# Patient Record
Sex: Male | Born: 1955 | Race: Asian | Hispanic: No | Marital: Married | Smoking: Former smoker
Health system: Southern US, Community
[De-identification: ages and names within clinical notes are randomized; demographics above are authoritative.]

## PROBLEM LIST (undated history)

## (undated) DIAGNOSIS — I5031 Acute diastolic (congestive) heart failure: Secondary | ICD-10-CM

## (undated) DIAGNOSIS — K255 Chronic or unspecified gastric ulcer with perforation: Secondary | ICD-10-CM

## (undated) DIAGNOSIS — K2970 Gastritis, unspecified, without bleeding: Secondary | ICD-10-CM

## (undated) HISTORY — PX: REPAIR OF PERFORATED ULCER: SHX6065

---

## 2012-10-16 ENCOUNTER — Emergency Department (HOSPITAL_COMMUNITY): Payer: Medicaid Other | Admitting: Anesthesiology

## 2012-10-16 ENCOUNTER — Inpatient Hospital Stay (HOSPITAL_COMMUNITY)
Admission: EM | Admit: 2012-10-16 | Discharge: 2012-10-25 | DRG: 326 | Disposition: A | Discharge status: Home Health | Payer: Medicaid Other | Attending: General Surgery | Admitting: General Surgery

## 2012-10-16 ENCOUNTER — Emergency Department (HOSPITAL_COMMUNITY): Payer: Medicaid Other

## 2012-10-16 ENCOUNTER — Encounter (HOSPITAL_COMMUNITY): Admission: EM | Disposition: A | Discharge status: Home Health | Payer: Self-pay | Source: Home / Self Care

## 2012-10-16 ENCOUNTER — Encounter (HOSPITAL_COMMUNITY): Payer: Self-pay | Admitting: Anesthesiology

## 2012-10-16 ENCOUNTER — Other Ambulatory Visit (HOSPITAL_COMMUNITY): Payer: Self-pay

## 2012-10-16 ENCOUNTER — Encounter (HOSPITAL_COMMUNITY): Payer: Self-pay | Admitting: Emergency Medicine

## 2012-10-16 DIAGNOSIS — D62 Acute posthemorrhagic anemia: Secondary | ICD-10-CM | POA: Diagnosis not present

## 2012-10-16 DIAGNOSIS — E875 Hyperkalemia: Secondary | ICD-10-CM

## 2012-10-16 DIAGNOSIS — I493 Ventricular premature depolarization: Secondary | ICD-10-CM

## 2012-10-16 DIAGNOSIS — I10 Essential (primary) hypertension: Secondary | ICD-10-CM | POA: Diagnosis present

## 2012-10-16 DIAGNOSIS — K560 Paralytic ileus: Secondary | ICD-10-CM | POA: Diagnosis not present

## 2012-10-16 DIAGNOSIS — N179 Acute kidney failure, unspecified: Secondary | ICD-10-CM | POA: Diagnosis not present

## 2012-10-16 DIAGNOSIS — K668 Other specified disorders of peritoneum: Secondary | ICD-10-CM | POA: Diagnosis present

## 2012-10-16 DIAGNOSIS — I4949 Other premature depolarization: Secondary | ICD-10-CM | POA: Diagnosis present

## 2012-10-16 DIAGNOSIS — D72829 Elevated white blood cell count, unspecified: Secondary | ICD-10-CM | POA: Diagnosis not present

## 2012-10-16 DIAGNOSIS — K255 Chronic or unspecified gastric ulcer with perforation: Secondary | ICD-10-CM | POA: Diagnosis present

## 2012-10-16 DIAGNOSIS — I509 Heart failure, unspecified: Secondary | ICD-10-CM | POA: Diagnosis not present

## 2012-10-16 DIAGNOSIS — K251 Acute gastric ulcer with perforation: Secondary | ICD-10-CM

## 2012-10-16 DIAGNOSIS — D696 Thrombocytopenia, unspecified: Secondary | ICD-10-CM | POA: Diagnosis not present

## 2012-10-16 DIAGNOSIS — I5031 Acute diastolic (congestive) heart failure: Secondary | ICD-10-CM

## 2012-10-16 DIAGNOSIS — K265 Chronic or unspecified duodenal ulcer with perforation: Principal | ICD-10-CM | POA: Diagnosis present

## 2012-10-16 DIAGNOSIS — Z87891 Personal history of nicotine dependence: Secondary | ICD-10-CM

## 2012-10-16 DIAGNOSIS — R198 Other specified symptoms and signs involving the digestive system and abdomen: Secondary | ICD-10-CM

## 2012-10-16 DIAGNOSIS — E876 Hypokalemia: Secondary | ICD-10-CM | POA: Diagnosis not present

## 2012-10-16 DIAGNOSIS — Z8719 Personal history of other diseases of the digestive system: Secondary | ICD-10-CM

## 2012-10-16 HISTORY — DX: Chronic or unspecified gastric ulcer with perforation: K25.5

## 2012-10-16 HISTORY — PX: LAPAROTOMY: SHX154

## 2012-10-16 HISTORY — PX: REPAIR OF PERFORATED ULCER: SHX6065

## 2012-10-16 HISTORY — DX: Gastritis, unspecified, without bleeding: K29.70

## 2012-10-16 HISTORY — DX: Acute diastolic (congestive) heart failure: I50.31

## 2012-10-16 LAB — CBC WITH DIFFERENTIAL/PLATELET
Basophils Absolute: 0 10*3/uL (ref 0.0–0.1)
Eosinophils Absolute: 0 10*3/uL (ref 0.0–0.7)
Hemoglobin: 12.2 g/dL — ABNORMAL LOW (ref 13.0–17.0)
Lymphocytes Relative: 3 % — ABNORMAL LOW (ref 12–46)
Lymphs Abs: 0.6 10*3/uL — ABNORMAL LOW (ref 0.7–4.0)
MCH: 25.7 pg — ABNORMAL LOW (ref 26.0–34.0)
MCV: 77.4 fL — ABNORMAL LOW (ref 78.0–100.0)
Neutro Abs: 13.9 10*3/uL — ABNORMAL HIGH (ref 1.7–7.7)
Platelets: 117 10*3/uL — ABNORMAL LOW (ref 150–400)
RBC: 4.74 MIL/uL (ref 4.22–5.81)
RDW: 16.1 % — ABNORMAL HIGH (ref 11.5–15.5)
WBC: 16.3 10*3/uL — ABNORMAL HIGH (ref 4.0–10.5)

## 2012-10-16 LAB — COMPREHENSIVE METABOLIC PANEL
ALT: 35 U/L (ref 0–53)
AST: 27 U/L (ref 0–37)
Albumin: 4 g/dL (ref 3.5–5.2)
Alkaline Phosphatase: 76 U/L (ref 39–117)
CO2: 22 mEq/L (ref 19–32)
Chloride: 102 mEq/L (ref 96–112)
GFR calc non Af Amer: 58 mL/min — ABNORMAL LOW (ref >90)
Potassium: 6.9 mEq/L (ref 3.5–5.1)
Sodium: 133 mEq/L — ABNORMAL LOW (ref 135–145)
Total Bilirubin: 1.3 mg/dL — ABNORMAL HIGH (ref 0.3–1.2)

## 2012-10-16 LAB — URINALYSIS, ROUTINE W REFLEX MICROSCOPIC
Bilirubin Urine: NEGATIVE
Glucose, UA: 250 mg/dL — AB
Ketones, ur: 15 mg/dL — AB
Nitrite: NEGATIVE
Protein, ur: NEGATIVE mg/dL
Specific Gravity, Urine: 1.039 — ABNORMAL HIGH (ref 1.005–1.030)
Urobilinogen, UA: 1 mg/dL (ref 0.0–1.0)

## 2012-10-16 LAB — BASIC METABOLIC PANEL
CO2: 21 mEq/L (ref 19–32)
Chloride: 101 mEq/L (ref 96–112)
Creatinine, Ser: 1.2 mg/dL (ref 0.50–1.35)
GFR calc non Af Amer: 65 mL/min — ABNORMAL LOW (ref >90)
Glucose, Bld: 119 mg/dL — ABNORMAL HIGH (ref 70–99)
Potassium: 4.9 mEq/L (ref 3.5–5.1)
Sodium: 131 mEq/L — ABNORMAL LOW (ref 135–145)

## 2012-10-16 LAB — TYPE AND SCREEN: Antibody Screen: NEGATIVE

## 2012-10-16 LAB — POTASSIUM: Potassium: 5.7 mEq/L — ABNORMAL HIGH (ref 3.5–5.1)

## 2012-10-16 LAB — GLUCOSE, CAPILLARY: Glucose-Capillary: 99 mg/dL (ref 70–99)

## 2012-10-16 SURGERY — LAPAROTOMY, EXPLORATORY
Anesthesia: General | Site: Abdomen | Wound class: Dirty or Infected

## 2012-10-16 MED ORDER — FENTANYL CITRATE 0.05 MG/ML IJ SOLN
INTRAMUSCULAR | Status: DC | PRN
  Administered 2012-10-16: 50 ug via INTRAVENOUS
  Administered 2012-10-16: 100 ug via INTRAVENOUS
  Administered 2012-10-16 (×2): 50 ug via INTRAVENOUS
  Administered 2012-10-16: 100 ug via INTRAVENOUS
  Administered 2012-10-16 (×3): 50 ug via INTRAVENOUS

## 2012-10-16 MED ORDER — 0.9 % SODIUM CHLORIDE (POUR BTL) OPTIME
TOPICAL | Status: DC | PRN
  Administered 2012-10-16 (×4): 1000 mL

## 2012-10-16 MED ORDER — ONDANSETRON HCL 4 MG/2ML IJ SOLN
4.0000 mg | Freq: Once | INTRAMUSCULAR | Status: AC
  Administered 2012-10-16: 4 mg via INTRAVENOUS
  Filled 2012-10-16: qty 2

## 2012-10-16 MED ORDER — HYDROMORPHONE HCL PF 1 MG/ML IJ SOLN
1.0000 mg | Freq: Once | INTRAMUSCULAR | Status: AC
  Administered 2012-10-16: 1 mg via INTRAVENOUS
  Filled 2012-10-16: qty 1

## 2012-10-16 MED ORDER — MIDAZOLAM HCL 5 MG/5ML IJ SOLN
INTRAMUSCULAR | Status: DC | PRN
  Administered 2012-10-16: 2 mg via INTRAVENOUS

## 2012-10-16 MED ORDER — OXYCODONE HCL 5 MG PO TABS: 5.0000 mg | ORAL_TABLET | Freq: Once | ORAL | Status: DC | PRN

## 2012-10-16 MED ORDER — ROCURONIUM BROMIDE 100 MG/10ML IV SOLN
INTRAVENOUS | Status: DC | PRN
  Administered 2012-10-16: 50 mg via INTRAVENOUS

## 2012-10-16 MED ORDER — ENOXAPARIN SODIUM 40 MG/0.4ML ~~LOC~~ SOLN
40.0000 mg | SUBCUTANEOUS | Status: DC
  Administered 2012-10-17 – 2012-10-18 (×2): 40 mg via SUBCUTANEOUS
  Filled 2012-10-16 (×2): qty 0.4

## 2012-10-16 MED ORDER — VANCOMYCIN HCL 500 MG IV SOLR
500.0000 mg | Freq: Two times a day (BID) | INTRAVENOUS | Status: DC
  Administered 2012-10-16 – 2012-10-19 (×6): 500 mg via INTRAVENOUS
  Filled 2012-10-16 (×7): qty 500

## 2012-10-16 MED ORDER — EVICEL 5 ML EX KIT
PACK | CUTANEOUS | Status: DC | PRN
  Administered 2012-10-16: 1

## 2012-10-16 MED ORDER — VANCOMYCIN HCL IN DEXTROSE 1-5 GM/200ML-% IV SOLN
1000.0000 mg | Freq: Once | INTRAVENOUS | Status: AC
  Administered 2012-10-16: 1000 mg via INTRAVENOUS
  Filled 2012-10-16: qty 200

## 2012-10-16 MED ORDER — EVICEL 5 ML EX KIT
PACK | CUTANEOUS | Status: AC
  Filled 2012-10-16: qty 1

## 2012-10-16 MED ORDER — SODIUM CHLORIDE 0.9 % IV SOLN
80.0000 mg | Freq: Once | INTRAVENOUS | Status: AC
  Administered 2012-10-16: 12:00:00 80 mg via INTRAVENOUS
  Filled 2012-10-16: qty 80

## 2012-10-16 MED ORDER — IOHEXOL 300 MG/ML  SOLN
25.0000 mL | INTRAMUSCULAR | Status: DC
  Administered 2012-10-16: 25 mL via ORAL

## 2012-10-16 MED ORDER — PIPERACILLIN-TAZOBACTAM 3.375 G IVPB
3.3750 g | Freq: Once | INTRAVENOUS | Status: AC
  Administered 2012-10-16: 3.375 g via INTRAVENOUS
  Filled 2012-10-16: qty 50

## 2012-10-16 MED ORDER — GLYCOPYRROLATE 0.2 MG/ML IJ SOLN
INTRAMUSCULAR | Status: DC | PRN
  Administered 2012-10-16: 0.6 mg via INTRAVENOUS

## 2012-10-16 MED ORDER — OXYCODONE HCL 5 MG/5ML PO SOLN: 5.0000 mg | Freq: Once | ORAL | Status: DC | PRN

## 2012-10-16 MED ORDER — PHENYLEPHRINE HCL 10 MG/ML IJ SOLN
INTRAMUSCULAR | Status: DC | PRN
  Administered 2012-10-16 (×2): 160 ug via INTRAVENOUS
  Administered 2012-10-16: 80 ug via INTRAVENOUS

## 2012-10-16 MED ORDER — LIDOCAINE HCL (CARDIAC) 20 MG/ML IV SOLN
INTRAVENOUS | Status: DC | PRN
  Administered 2012-10-16: 80 mg via INTRAVENOUS

## 2012-10-16 MED ORDER — HYDROMORPHONE HCL PF 1 MG/ML IJ SOLN
0.2500 mg | INTRAMUSCULAR | Status: DC | PRN
  Administered 2012-10-16 (×2): 0.5 mg via INTRAVENOUS

## 2012-10-16 MED ORDER — ONDANSETRON HCL 4 MG/2ML IJ SOLN
4.0000 mg | Freq: Four times a day (QID) | INTRAMUSCULAR | Status: DC
  Administered 2012-10-16 – 2012-10-25 (×34): 4 mg via INTRAVENOUS
  Filled 2012-10-16 (×34): qty 2

## 2012-10-16 MED ORDER — DEXTROSE 50 % IV SOLN
1.0000 | Freq: Once | INTRAVENOUS | Status: AC
  Administered 2012-10-16: 50 mL via INTRAVENOUS
  Filled 2012-10-16: qty 50

## 2012-10-16 MED ORDER — PIPERACILLIN-TAZOBACTAM 3.375 G IVPB
3.3750 g | Freq: Three times a day (TID) | INTRAVENOUS | Status: DC
  Administered 2012-10-16 – 2012-10-25 (×26): 3.375 g via INTRAVENOUS
  Filled 2012-10-16 (×28): qty 50

## 2012-10-16 MED ORDER — FLUCONAZOLE IN SODIUM CHLORIDE 200-0.9 MG/100ML-% IV SOLN
200.0000 mg | INTRAVENOUS | Status: DC
  Administered 2012-10-16 – 2012-10-24 (×9): 200 mg via INTRAVENOUS
  Filled 2012-10-16 (×10): qty 100

## 2012-10-16 MED ORDER — PROPOFOL 10 MG/ML IV BOLUS
INTRAVENOUS | Status: DC | PRN
  Administered 2012-10-16: 150 mg via INTRAVENOUS

## 2012-10-16 MED ORDER — SODIUM CHLORIDE 0.9 % IV BOLUS (SEPSIS)
1000.0000 mL | Freq: Once | INTRAVENOUS | Status: AC
  Administered 2012-10-16: 1000 mL via INTRAVENOUS

## 2012-10-16 MED ORDER — INSULIN ASPART 100 UNIT/ML ~~LOC~~ SOLN
10.0000 [IU] | Freq: Once | SUBCUTANEOUS | Status: AC
  Administered 2012-10-16: 10 [IU] via INTRAVENOUS
  Filled 2012-10-16: qty 1

## 2012-10-16 MED ORDER — ONDANSETRON HCL 4 MG/2ML IJ SOLN: 4.0000 mg | Freq: Four times a day (QID) | INTRAMUSCULAR | Status: DC | PRN

## 2012-10-16 MED ORDER — VECURONIUM BROMIDE 10 MG IV SOLR
INTRAVENOUS | Status: DC | PRN
  Administered 2012-10-16: 1 mg via INTRAVENOUS

## 2012-10-16 MED ORDER — NEOSTIGMINE METHYLSULFATE 1 MG/ML IJ SOLN
INTRAMUSCULAR | Status: DC | PRN
  Administered 2012-10-16: 4 mg via INTRAVENOUS

## 2012-10-16 MED ORDER — ONDANSETRON HCL 4 MG/2ML IJ SOLN
INTRAMUSCULAR | Status: DC | PRN
  Administered 2012-10-16: 4 mg via INTRAVENOUS

## 2012-10-16 MED ORDER — HYDROMORPHONE HCL PF 1 MG/ML IJ SOLN
INTRAMUSCULAR | Status: AC
  Filled 2012-10-16: qty 1

## 2012-10-16 MED ORDER — IOHEXOL 300 MG/ML  SOLN
80.0000 mL | Freq: Once | INTRAMUSCULAR | Status: AC | PRN
  Administered 2012-10-16: 80 mL via INTRAVENOUS

## 2012-10-16 MED ORDER — LACTATED RINGERS IV SOLN
INTRAVENOUS | Status: DC | PRN
  Administered 2012-10-16 (×2): via INTRAVENOUS

## 2012-10-16 MED ORDER — PANTOPRAZOLE SODIUM 40 MG IV SOLR
40.0000 mg | Freq: Two times a day (BID) | INTRAVENOUS | Status: DC
  Administered 2012-10-16 – 2012-10-24 (×16): 40 mg via INTRAVENOUS
  Filled 2012-10-16 (×21): qty 40

## 2012-10-16 MED ORDER — HYDROMORPHONE HCL PF 1 MG/ML IJ SOLN
1.0000 mg | INTRAMUSCULAR | Status: DC | PRN
  Administered 2012-10-17 – 2012-10-18 (×9): 1 mg via INTRAVENOUS
  Filled 2012-10-16 (×9): qty 1

## 2012-10-16 MED ORDER — DEXTROSE-NACL 5-0.9 % IV SOLN
INTRAVENOUS | Status: DC
  Administered 2012-10-16 – 2012-10-22 (×16): via INTRAVENOUS
  Administered 2012-10-23: 20 mL/h via INTRAVENOUS
  Administered 2012-10-23: 02:00:00 via INTRAVENOUS

## 2012-10-16 MED ORDER — SODIUM CHLORIDE 0.9 % IV SOLN
1.0000 g | Freq: Once | INTRAVENOUS | Status: AC
  Administered 2012-10-16: 1 g via INTRAVENOUS
  Filled 2012-10-16 (×2): qty 10

## 2012-10-16 SURGICAL SUPPLY — 40 items
BLADE SURG ROTATE 9660 (MISCELLANEOUS) IMPLANT
CANISTER SUCTION 2500CC (MISCELLANEOUS) ×4 IMPLANT
CHLORAPREP W/TINT 26ML (MISCELLANEOUS) ×2 IMPLANT
CLOTH BEACON ORANGE TIMEOUT ST (SAFETY) ×2 IMPLANT
COVER SURGICAL LIGHT HANDLE (MISCELLANEOUS) ×2 IMPLANT
DRAIN CHANNEL 19F RND (DRAIN) ×2 IMPLANT
DRAPE LAPAROSCOPIC ABDOMINAL (DRAPES) ×2 IMPLANT
DRAPE UTILITY 15X26 W/TAPE STR (DRAPE) ×4 IMPLANT
DRAPE WARM FLUID 44X44 (DRAPE) ×2 IMPLANT
ELECT BLADE 6.5 EXT (BLADE) IMPLANT
ELECT CAUTERY BLADE 6.4 (BLADE) ×2 IMPLANT
ELECT REM PT RETURN 9FT ADLT (ELECTROSURGICAL) ×2
ELECTRODE REM PT RTRN 9FT ADLT (ELECTROSURGICAL) ×1 IMPLANT
EVACUATOR SILICONE 100CC (DRAIN) ×2 IMPLANT
GLOVE BIOGEL M STRL SZ7.5 (GLOVE) ×2 IMPLANT
GLOVE BIOGEL PI IND STRL 8 (GLOVE) ×1 IMPLANT
GLOVE BIOGEL PI INDICATOR 8 (GLOVE) ×1
GOWN STRL NON-REIN LRG LVL3 (GOWN DISPOSABLE) ×4 IMPLANT
GOWN STRL REIN XL XLG (GOWN DISPOSABLE) ×2 IMPLANT
KIT BASIN OR (CUSTOM PROCEDURE TRAY) ×2 IMPLANT
KIT ROOM TURNOVER OR (KITS) ×2 IMPLANT
LIGASURE IMPACT 36 18CM CVD LR (INSTRUMENTS) IMPLANT
NS IRRIG 1000ML POUR BTL (IV SOLUTION) ×8 IMPLANT
PACK GENERAL/GYN (CUSTOM PROCEDURE TRAY) ×2 IMPLANT
PAD ARMBOARD 7.5X6 YLW CONV (MISCELLANEOUS) ×2 IMPLANT
SPECIMEN JAR LARGE (MISCELLANEOUS) IMPLANT
SPONGE LAP 18X18 X RAY DECT (DISPOSABLE) IMPLANT
STAPLER VISISTAT 35W (STAPLE) ×2 IMPLANT
SUCTION POOLE TIP (SUCTIONS) ×2 IMPLANT
SUT ETHILON 2 0 FS 18 (SUTURE) ×2 IMPLANT
SUT PDS AB 1 TP1 96 (SUTURE) ×4 IMPLANT
SUT SILK 2 0 SH CR/8 (SUTURE) ×2 IMPLANT
SUT SILK 2 0 TIES 10X30 (SUTURE) ×2 IMPLANT
SUT SILK 3 0 SH CR/8 (SUTURE) ×2 IMPLANT
SUT SILK 3 0 TIES 10X30 (SUTURE) ×2 IMPLANT
SUT VIC AB 3-0 SH 18 (SUTURE) IMPLANT
TOWEL OR 17X26 10 PK STRL BLUE (TOWEL DISPOSABLE) ×2 IMPLANT
TRAY FOLEY CATH 14FRSI W/METER (CATHETERS) IMPLANT
WATER STERILE IRR 1000ML POUR (IV SOLUTION) IMPLANT
YANKAUER SUCT BULB TIP NO VENT (SUCTIONS) IMPLANT

## 2012-10-16 NOTE — H&P (Signed)
Drew Walters [id number] 10/16/2012  Primary Care Doctor: None Reason for Admission: pneumoperitoneum  Chief Complaint: abdominal pain HPI: This is a 57 year old [place] speaking male who presented to [facility] this morning with abdominal pain.  The patient just moved here from [place] 2 weeks ago.  Prior to his arrival, he had a PE, which did not reveal any medical problems.  He does admit to being treated for gastritis in the past, but denies every being told he has an ulcer.    Last night, he awoke with an acute onset of upper abdominal pain.  He states that it hurts to breath, which makes him SOB.  He admits to chest pain, but this radiates up from his epigastrium.  He has some nausea and then one episode of emesis here in the ED.  He denies any fevers, chills, or diarrhea.  Due to continues pain, he came to the ED where he was found to have pneumoperitoneum on his CT scan with thickening of his second portion of his duodenum.  Findings were thought to be secondary to possible perforated duodenal ulcer.  He was also noted to have a K of 6.9.  He was treated by the EDP and we are awaiting a repeat lab value.  We have been asked to evaluate the patient for surgical intervention.  PMH:  1. Gastritis  2. GERD  History reviewed. No pertinent past surgical history.  Family History: No pertinent family history.   Social History:  reports that he has quit smoking. He does not have any smokeless tobacco history on file. He reports that he does not drink alcohol or use illicit drugs.  Allergies: No Known Allergies  Physical Exam: General: pleasant, WD, WN [place] male who is laying in bed in NAD HEENT: head is normocephalic, atraumatic.  Sclera are noninjected.  PERRL.  Ears and nose without any masses or lesions.  Mouth is pink and moist Heart: tachycardic, intermittent trigeminy with return of normal sinus tach.  Normal s1,s2. No obvious murmurs, gallops, or rubs noted.  Palpable radial and pedal  pulses bilaterally Lungs: CTAB, but decreased BS at the bases, no wheezes, rhonchi, or rales noted.  Respiratory effort nonlabored, but poor secondary to pain. Abd: soft, greatest tenderness on right side of abdomen.  Voluntary guarding, no significant peritoneal signs noted, ND, +BS, no masses, hernias, or organomegaly MS: all 4 extremities are symmetrical with no cyanosis, clubbing, or edema. Skin: warm and dry with no masses, lesions, or rashes Psych: A&Ox3 with an appropriate affect.   Results for orders placed during the hospital encounter of 10/16/12 (from the past 48 hour(s))  CBC WITH DIFFERENTIAL     Status: Abnormal   Collection Time    10/16/12  7:30 AM      Result Value Range   WBC 16.3 (*) 4.0 - 10.5 K/uL   RBC 4.74  4.22 - 5.81 MIL/uL   Hemoglobin 12.2 (*) 13.0 - 17.0 g/dL   HCT 65.7 (*) 84.6 - 96.2 %   MCV 77.4 (*) 78.0 - 100.0 fL   MCH 25.7 (*) 26.0 - 34.0 pg   MCHC 33.2  30.0 - 36.0 g/dL   RDW 95.2 (*) 84.1 - 32.4 %   Platelets 117 (*) 150 - 400 K/uL   Comment: PLATELET COUNT CONFIRMED BY SMEAR     LARGE PLATELETS PRESENT   Neutrophils Relative % 86 (*) 43 - 77 %   Neutro Abs 13.9 (*) 1.7 - 7.7 K/uL   Lymphocytes  Relative 3 (*) 12 - 46 %   Lymphs Abs 0.6 (*) 0.7 - 4.0 K/uL   Monocytes Relative 11  3 - 12 %   Monocytes Absolute 1.8 (*) 0.1 - 1.0 K/uL   Eosinophils Relative 0  0 - 5 %   Eosinophils Absolute 0.0  0.0 - 0.7 K/uL   Basophils Relative 0  0 - 1 %   Basophils Absolute 0.0  0.0 - 0.1 K/uL  COMPREHENSIVE METABOLIC PANEL     Status: Abnormal   Collection Time    10/16/12  7:30 AM      Result Value Range   Sodium 133 (*) 135 - 145 mEq/L   Potassium 6.9 (*) 3.5 - 5.1 mEq/L   Comment: CRITICAL RESULT CALLED TO, READ BACK BY AND VERIFIED WITH:     DAVIS,W RN 4010 10/16/12 LEONARD,A   Chloride 102  96 - 112 mEq/L   CO2 22  19 - 32 mEq/L   Glucose, Bld 97  70 - 99 mg/dL   BUN 19  6 - 23 mg/dL   Creatinine, Ser 2.72  0.50 - 1.35 mg/dL   Calcium 8.7  8.4 -  53.6 mg/dL   Total Protein 7.8  6.0 - 8.3 g/dL   Albumin 4.0  3.5 - 5.2 g/dL   AST 27  0 - 37 U/L   ALT 35  0 - 53 U/L   Alkaline Phosphatase 76  39 - 117 U/L   Total Bilirubin 1.3 (*) 0.3 - 1.2 mg/dL   GFR calc non Af Amer 58 (*) >90 mL/min   GFR calc Af Amer 67 (*) >90 mL/min   Comment: (NOTE)     The eGFR has been calculated using the CKD EPI equation.     This calculation has not been validated in all clinical situations.     eGFR's persistently <90 mL/min signify possible Chronic Kidney     Disease.  LIPASE, BLOOD     Status: None   Collection Time    10/16/12  7:30 AM      Result Value Range   Lipase 24  11 - 59 U/L   Ct Abdomen Pelvis W Contrast  10/16/2012   CLINICAL DATA:  Diffuse nonspecific abdominal pain.  EXAM: CT ABDOMEN AND PELVIS WITH CONTRAST  TECHNIQUE: Multidetector CT imaging of the abdomen and pelvis was performed using the standard protocol following bolus administration of intravenous contrast.  CONTRAST:  80mL OMNIPAQUE IOHEXOL 300 MG/ML  SOLN  COMPARISON:  None available.  FINDINGS: Bilateral lower lobe airspace disease is evident with diffuse interstitial coarsening. No significant consolidation is identified. The heart size is normal. No significant pleural or pericardial effusion is evident.  The proximal stomach is dilated with a fluid level of contrast. There is a relative obstruction with inflammatory changes involving the 2nd portion of the duodenum. The more distal duodenum and the pancreas are within normal limits.  The liver and spleen are normal. Common bile duct and gallbladder are unremarkable. The adrenal glands are within normal limits.  Pneumoperitoneum there is evident with gas along the anterior peritoneal wall and within Morrison's pouch. A 9 mm cyst is present at the upper pole of the left kidney. The kidneys and ureters are otherwise within normal limits.  The rectosigmoid colon is within normal limits. There is extensive fluid surrounding the cecum  and in the right pericolic gutter. Inflammatory changes are present at the hepatic flexure.  Stranding is present within the small bowel mesenteric. No focal  small bowel dilation or mass is evident. Atherosclerotic calcifications are present in the aorta and branch vessels without aneurysm.  Bone windows demonstrate no focal lytic or blastic lesions.  IMPRESSION: 1. Pneumoperitoneum suggesting bowel perforation. 2. Inflammatory changes about the hepatic flexure of the colon and 2nd portion of the duodenum. This likely represents a perforated diverticulum of the duodenum which appears more inflamed than the colon. 3. Relative outlet obstruction of the stomach is likely secondary to the duodenum inflammatory changes. 4. Interstitial coarsening and airspace disease at the lung bases bilaterally likely represents edema. No significant airspace consolidation is evident. CriticalValue/emergent results were called by telephone at the time of interpretation on 10/16/2012 at 10:25 AMto Dr. Margarita Grizzle , who verbally acknowledged these results.   Electronically Signed   By: Gennette Pac   On: 10/16/2012 10:28   Dg Chest Port 1 View  10/16/2012   CLINICAL DATA:  Chest and abdominal pain.  EXAM: PORTABLE CHEST - 1 VIEW  COMPARISON:  None.  FINDINGS: The patient is rotated to the right on today's radiograph, reducing diagnostic sensitivity and specificity. Low lung volumes are present, causing crowding of the pulmonary vasculature. Borderline cardiomegaly, which may be exaggerated by the low lung volumes. Linear opacity/interstitial accentuation at the lung bases. No blunting of the costophrenic angles observed.  IMPRESSION: 1. Low lung volumes with linear opacities at the lung bases favoring subsegmental atelectasis over a basilar interstitial edema process.   Electronically Signed   By: Herbie Baltimore   On: 10/16/2012 07:56    Review of Systems  Constitutional: Negative for fever, chills and weight loss.   Respiratory: Positive for shortness of breath.   Cardiovascular: Positive for chest pain and palpitations. Negative for leg swelling.  Gastrointestinal: Positive for heartburn, nausea and vomiting. Negative for diarrhea, constipation, blood in stool and melena.  Genitourinary: Negative.  Negative for dysuria.  Musculoskeletal: Negative.   Neurological: Negative.   Psychiatric/Behavioral: Negative.     Blood pressure 113/65, pulse 87, temperature 98.5 F (36.9 C), temperature source Oral, resp. rate 23, SpO2 100.00%. Physical Exam   Assessment/Plan 1. Pneumoperitoneum, likely secondary to perforated duodenal ulcer 2. Leukocytosis 3. Hyperkalemia  4. Underlying lung disease, hx tobacco use  Plan: 1. The patient will require an emergent exploratory laparotomy.  He has some significant thickening of his duodenum on his CT scan.  Hopefully, he will just have a perforated ulcer, but cannot completely rule out malignancy given this appearance.  The patient's potassium was 6.9.  He has been given insulin and calcium gluconate.  We are awaiting a repeat level.  I have discussed this patient with anaesthesia as well given his K level along with trigeminy and peaked T waves on EKG. 2. I have had a d/w the patient and his family via a vietnamese interpretor to explain all of the above information.  I have also explained to the patient about potential risks and complications of this surgery including, but not limited to bleeding, infection, failure of repair, recurrent need for OR, need for bowel resection, possible fistula, possible Whipple, and possible need for mechanical ventilation.  They all seem to understand and agree.  Husain Costabile E 10/16/2012, 12:31 PM Pager: [phone]

## 2012-10-16 NOTE — Anesthesia Procedure Notes (Signed)
Procedure Name: Intubation Date/Time: 10/16/2012 2:02 PM Performed by: Whitman Hero Pre-anesthesia Checklist: Patient identified, Emergency Drugs available, Suction available, Patient being monitored and Timeout performed Patient Re-evaluated:Patient Re-evaluated prior to inductionOxygen Delivery Method: Circle system utilized Preoxygenation: Pre-oxygenation with 100% oxygen Intubation Type: IV induction Ventilation: Mask ventilation without difficulty Laryngoscope Size: Mac and 3 Grade View: Grade II Tube type: Oral Tube size: 8.0 mm Number of attempts: 1 Airway Equipment and Method: Stylet Placement Confirmation: ETT inserted through vocal cords under direct vision,  breath sounds checked- equal and bilateral and positive ETCO2 Secured at: 22 cm Tube secured with: Tape Dental Injury: Teeth and Oropharynx as per pre-operative assessment

## 2012-10-16 NOTE — ED Notes (Signed)
Received call from lab-critical K+ 6.9.  This rn will advise MD.

## 2012-10-16 NOTE — ED Notes (Signed)
Pt return rad. 

## 2012-10-16 NOTE — ED Provider Notes (Addendum)
CSN: [id number]     Arrival date & time 10/16/12  9629 History   First MD Initiated Contact with Patient 10/16/12 [phone]    level 5 Chief Complaint  Patient presents with  . Abdominal Pain   (Consider location/radiation/quality/duration/timing/severity/associated sxs/prior Treatment) HPI  History obtained through translator- patient speaks [place]    57 y.o. Male awoke at midnight with diffuse abdominal pain that hurt all over his abdomen.  He states it was like a crampy pain.  Pain began severe in nature.  No nausea, vomiting , or diarrhea. Patient given peptobismol without relief.   Patient states pain is worsened by pain and this makes him feel short of breath.  No cough, fever, chest pain, and has not had any similar symptoms in the past, normal bowel movements with last one yesterday, no urinary symptoms.  Patient was in usual state of health until last night.  Patient arrived from [place] two weeks ago.  Patient has been eating traditional food.  Patient was brought here by his son from his village and flew from [place] to [place] to [place] then here.  No known health history or surgeries.    History reviewed. No pertinent past medical history. History reviewed. No pertinent past surgical history. No family history on file. History  Substance Use Topics  . Smoking status: Former Games developer  . Smokeless tobacco: Not on file  . Alcohol Use: No    Review of Systems  All other systems reviewed and are negative.    Allergies  Review of patient's allergies indicates no known allergies.  Home Medications   Current Outpatient Rx  Name  Route  Sig  Dispense  Refill  . bismuth subsalicylate (PEPTO BISMOL) 262 MG/15ML suspension   Oral   Take 5 mLs by mouth every 6 (six) hours as needed for indigestion.          BP 121/78  Pulse 92  Temp(Src) 98.5 F (36.9 C) (Oral)  Resp 34  SpO2 100% Physical Exam  Nursing note and vitals reviewed. Constitutional: He appears well-developed  and well-nourished.  HENT:  Head: Normocephalic and atraumatic.  Right Ear: Tympanic membrane, external ear and ear canal normal.  Left Ear: Tympanic membrane, external ear and ear canal normal.  Nose: Nose normal.  Mouth/Throat: Oropharynx is clear and moist.  Eyes: Conjunctivae and EOM are normal. Pupils are equal, round, and reactive to light. Right eye exhibits no discharge. Left eye exhibits no discharge. No scleral icterus.  Neck: Normal range of motion. Neck supple. No JVD present. No tracheal deviation present. No thyromegaly present.  Cardiovascular: Normal rate, regular rhythm, normal heart sounds and intact distal pulses.  Exam reveals no gallop.   No murmur heard. Pulmonary/Chest: Effort normal. No respiratory distress. He has wheezes.  Few end expiratory wheezes bilaterally  Abdominal: Normal appearance. He exhibits no distension and no mass. Bowel sounds are absent. There is generalized tenderness. There is guarding. There is no rigidity, no CVA tenderness and negative Murphy's sign. No hernia. Hernia confirmed negative in the ventral area, confirmed negative in the right inguinal area and confirmed negative in the left inguinal area.  Lymphadenopathy:    He has no cervical adenopathy.    ED Course  Procedures (including critical care time) Labs Review Labs Reviewed - No data to display Imaging Review No results found.8:59 AM Above cbc reviewed- significant for leukocytosis with left shift.   9:00 AM Patient feels slightly improved after dilaudid but drank contrast and vomited it all.  Radiology called and ct scan is to be expedited without repeat oral contrast. Discussed clinical concerns with Dr. Rayburn Go.   Results for orders placed during the hospital encounter of 10/16/12  CBC WITH DIFFERENTIAL      Result Value Range   WBC 16.3 (*) 4.0 - 10.5 K/uL   RBC 4.74  4.22 - 5.81 MIL/uL   Hemoglobin 12.2 (*) 13.0 - 17.0 g/dL   HCT 16.1 (*) 09.6 - 04.5 %   MCV 77.4 (*)  78.0 - 100.0 fL   MCH 25.7 (*) 26.0 - 34.0 pg   MCHC 33.2  30.0 - 36.0 g/dL   RDW 40.9 (*) 81.1 - 91.4 %   Platelets 117 (*) 150 - 400 K/uL   Neutrophils Relative % 86 (*) 43 - 77 %   Neutro Abs 13.9 (*) 1.7 - 7.7 K/uL   Lymphocytes Relative 3 (*) 12 - 46 %   Lymphs Abs 0.6 (*) 0.7 - 4.0 K/uL   Monocytes Relative 11  3 - 12 %   Monocytes Absolute 1.8 (*) 0.1 - 1.0 K/uL   Eosinophils Relative 0  0 - 5 %   Eosinophils Absolute 0.0  0.0 - 0.7 K/uL   Basophils Relative 0  0 - 1 %   Basophils Absolute 0.0  0.0 - 0.1 K/uL  COMPREHENSIVE METABOLIC PANEL      Result Value Range   Sodium 133 (*) 135 - 145 mEq/L   Potassium 6.9 (*) 3.5 - 5.1 mEq/L   Chloride 102  96 - 112 mEq/L   CO2 22  19 - 32 mEq/L   Glucose, Bld 97  70 - 99 mg/dL   BUN 19  6 - 23 mg/dL   Creatinine, Ser 7.82  0.50 - 1.35 mg/dL   Calcium 8.7  8.4 - 95.6 mg/dL   Total Protein 7.8  6.0 - 8.3 g/dL   Albumin 4.0  3.5 - 5.2 g/dL   AST 27  0 - 37 U/L   ALT 35  0 - 53 U/L   Alkaline Phosphatase 76  39 - 117 U/L   Total Bilirubin 1.3 (*) 0.3 - 1.2 mg/dL   GFR calc non Af Amer 58 (*) >90 mL/min   GFR calc Af Amer 67 (*) >90 mL/min  LIPASE, BLOOD      Result Value Range   Lipase 24  11 - 59 U/L    9:26 AM Made aware of potassium of 6.9.  Patient with mildly peaked t waves on ekg.  Calcium, insulin, and d50 ordered.    Date: 10/16/2012  Rate: 92  Rhythm: normal sinus rhythm  QRS Axis: normal  Intervals: normal  ST/T Wave abnormalities: normal  Conduction Disutrbances:none  Narrative Interpretation: pvcs, some peaking of t waves.  Old EKG Reviewed: none available  10:25 AM Call received from Dr. Alfredo Batty with ct results with perforation with probable duodenal source.   Dg Chest Port 1 View  10/16/2012   CLINICAL DATA:  Chest and abdominal pain.  EXAM: PORTABLE CHEST - 1 VIEW  COMPARISON:  None.  FINDINGS: The patient is rotated to the right on today's radiograph, reducing diagnostic sensitivity and specificity.  Low lung volumes are present, causing crowding of the pulmonary vasculature. Borderline cardiomegaly, which may be exaggerated by the low lung volumes. Linear opacity/interstitial accentuation at the lung bases. No blunting of the costophrenic angles observed.  IMPRESSION: 1. Low lung volumes with linear opacities at the lung bases favoring subsegmental atelectasis over a basilar interstitial edema  process.   Electronically Signed   By: Herbie Baltimore   On: 10/16/2012 07:56  Ct Abdomen Pelvis W Contrast  10/16/2012   CLINICAL DATA:  Diffuse nonspecific abdominal pain.  EXAM: CT ABDOMEN AND PELVIS WITH CONTRAST  TECHNIQUE: Multidetector CT imaging of the abdomen and pelvis was performed using the standard protocol following bolus administration of intravenous contrast.  CONTRAST:  80mL OMNIPAQUE IOHEXOL 300 MG/ML  SOLN  COMPARISON:  None available.  FINDINGS: Bilateral lower lobe airspace disease is evident with diffuse interstitial coarsening. No significant consolidation is identified. The heart size is normal. No significant pleural or pericardial effusion is evident.  The proximal stomach is dilated with a fluid level of contrast. There is a relative obstruction with inflammatory changes involving the 2nd portion of the duodenum. The more distal duodenum and the pancreas are within normal limits.  The liver and spleen are normal. Common bile duct and gallbladder are unremarkable. The adrenal glands are within normal limits.  Pneumoperitoneum there is evident with gas along the anterior peritoneal wall and within Morrison's pouch. A 9 mm cyst is present at the upper pole of the left kidney. The kidneys and ureters are otherwise within normal limits.  The rectosigmoid colon is within normal limits. There is extensive fluid surrounding the cecum and in the right pericolic gutter. Inflammatory changes are present at the hepatic flexure.  Stranding is present within the small bowel mesenteric. No focal small bowel  dilation or mass is evident. Atherosclerotic calcifications are present in the aorta and branch vessels without aneurysm.  Bone windows demonstrate no focal lytic or blastic lesions.  IMPRESSION: 1. Pneumoperitoneum suggesting bowel perforation. 2. Inflammatory changes about the hepatic flexure of the colon and 2nd portion of the duodenum. This likely represents a perforated diverticulum of the duodenum which appears more inflamed than the colon. 3. Relative outlet obstruction of the stomach is likely secondary to the duodenum inflammatory changes. 4. Interstitial coarsening and airspace disease at the lung bases bilaterally likely represents edema. No significant airspace consolidation is evident. CriticalValue/emergent results were called by telephone at the time of interpretation on 10/16/2012 at 10:25 AMto Dr. Margarita Grizzle , who verbally acknowledged these results.   Electronically Signed   By: Gennette Pac   On: 10/16/2012 10:28   Dg Chest Port 1 View  10/16/2012   CLINICAL DATA:  Chest and abdominal pain.  EXAM: PORTABLE CHEST - 1 VIEW  COMPARISON:  None.  FINDINGS: The patient is rotated to the right on today's radiograph, reducing diagnostic sensitivity and specificity. Low lung volumes are present, causing crowding of the pulmonary vasculature. Borderline cardiomegaly, which may be exaggerated by the low lung volumes. Linear opacity/interstitial accentuation at the lung bases. No blunting of the costophrenic angles observed.  IMPRESSION: 1. Low lung volumes with linear opacities at the lung bases favoring subsegmental atelectasis over a basilar interstitial edema process.   Electronically Signed   By: Herbie Baltimore   On: 10/16/2012 07:56   MDM  No diagnosis found. 57 yo male from [place] , does not speak English, who presents today with sudden onset of abdominal pain and dyspnea.  He has perforated viscus on CT with free air, edema, and likely source of duodenum.  Patient is also dyspneic with  atelectasis and edema in lung bases likely due to edema in abdomen.  Patient care discussed with patient via family and interpretor phones are being located for use.  I have spoken with the pa on call for general  surgery and Ms. Unknown Jim is here evaluating patient.  Patient was found to be hyperkalemic with normal ekg and calcium and iv insulin were given with repeat pending.    CRITICAL CARE Performed by: Hilario Quarry Total critical care time: 50 Critical care time was exclusive of separately billable procedures and treating other patients. Critical care was necessary to treat or prevent imminent or life-threatening deterioration. Critical care was time spent personally by me on the following activities: development of treatment plan with patient and/or surrogate as well as nursing, discussions with consultants, evaluation of patient's response to treatment, examination of patient, obtaining history from patient or surrogate, ordering and performing treatments and interventions, ordering and review of laboratory studies, ordering and review of radiographic studies, pulse oximetry and re-evaluation of patient's condition.     Hilario Quarry, MD 10/16/12 1103  Hilario Quarry, MD 10/16/12 [phone]

## 2012-10-16 NOTE — ED Notes (Signed)
Patient transported to X-ray 

## 2012-10-16 NOTE — Brief Op Note (Addendum)
10/16/2012  3:28 PM  PATIENT:  Meril Bur  57 y.o. male  PRE-OPERATIVE DIAGNOSIS:  Free intra-abdominal air  POST-OPERATIVE DIAGNOSIS:  perforated distal gastric ulcer  PROCEDURE:  Procedure(s): EXPLORATORY LAPAROTOMY (N/A) REPAIR OF PERFORATED GASTRIC ULCER ,GRAHAM PATCH (N/A)  SURGEON:  Surgeon(s) and Role:    * Atilano Ina, MD - Primary  PHYSICIAN ASSISTANT: Alm Bustard, NP  ASSISTANTS: none   ANESTHESIA:  gen  EBL:  Total I/O In: -  Out: 150 [Urine:150]  BLOOD ADMINISTERED:none  DRAINS: (19 fr) Jackson-Pratt drain(s) with closed bulb suction in the RUQ   LOCAL MEDICATIONS USED:  NONE  SPECIMEN:  No Specimen  DISPOSITION OF SPECIMEN:  N/A  COUNTS:  YES  TOURNIQUET:  * No tourniquets in log *  DICTATION: .Other Dictation: Dictation Number [id number]  PLAN OF CARE: Admit to inpatient   PATIENT DISPOSITION:  PACU - hemodynamically stable.   Delay start of Pharmacological VTE agent (>24hrs) due to surgical blood loss or risk of bleeding: no  Mary Sella. Andrey Campanile, MD, FACS General, Bariatric, & Minimally Invasive Surgery [facility], [place]

## 2012-10-16 NOTE — Progress Notes (Signed)
Call & use of Si Gaul, Sarah, /w language line, vietnamese. All questions & concerns addressed with Dr. Andrey Campanile, family & patient involved  In call.

## 2012-10-16 NOTE — Consult Note (Signed)
PHARMACY CONSULT NOTE - INITIAL  Pharmacy Consult for :   Vancomycin Indication:  Perforated gastric ulcer, s/p exploratory LAP repair  Hospital Problems: Principal Problem:   Pneumoperitoneum Active Problems:   Hyperkalemia   Leukocytosis   H/O gastritis   Allergies: No Known Allergies  Patient Measurements: Height: 5\' 2"  (157.5 cm) Weight: 124 lb 9 oz (56.5 kg) IBW/kg (Calculated) : 54.6  Vital Signs: BP 144/77  Pulse 89  Temp(Src) 98 F (36.7 C) (Oral)  Resp 16  Ht 5\' 2"  (1.575 m)  Wt 124 lb 9 oz (56.5 kg)  BMI 22.78 kg/m2  SpO2 94%  Labs:  Recent Labs  10/16/12 0730 10/16/12 1647  WBC 16.3*  --   HGB 12.2*  --   PLT 117*  --   CREATININE 1.31 1.20   Estimated Creatinine Clearance: 51.2 ml/min (by C-G formula based on Cr of 1.2).    Medical/Surgical History: Past Medical History  Diagnosis Date  . Gastritis    History reviewed. No pertinent past surgical history.  Medications:  Prescriptions prior to admission  Medication Sig Dispense Refill  . bismuth subsalicylate (PEPTO BISMOL) 262 MG/15ML suspension Take 5 mLs by mouth every 6 (six) hours as needed for indigestion.       Scheduled:  . [START ON 10/17/2012] enoxaparin (LOVENOX) injection  40 mg Subcutaneous Q24H  . fluconazole (DIFLUCAN) IV  200 mg Intravenous Q24H  . HYDROmorphone      . ondansetron  4 mg Intravenous Q6H  . pantoprazole (PROTONIX) IV  40 mg Intravenous Q12H  . piperacillin-tazobactam (ZOSYN)  IV  3.375 g Intravenous Q8H   Anti-infectives   Start     Dose/Rate Route Frequency Ordered Stop   10/16/12 2000  piperacillin-tazobactam (ZOSYN) IVPB 3.375 g     3.375 g 12.5 mL/hr over 240 Minutes Intravenous Every 8 hours 10/16/12 1943     10/16/12 2000  fluconazole (DIFLUCAN) IVPB 200 mg     200 mg 100 mL/hr over 60 Minutes Intravenous Every 24 hours 10/16/12 1943     10/16/12 1030  [MAR Hold]  piperacillin-tazobactam (ZOSYN) IVPB 3.375 g     (On MAR Hold since 10/16/12 1245)    3.375 g 12.5 mL/hr over 240 Minutes Intravenous  Once 10/16/12 1028 10/16/12 1633   10/16/12 1030  vancomycin (VANCOCIN) IVPB 1000 mg/200 mL premix     1,000 mg 200 mL/hr over 60 Minutes Intravenous  Once 10/16/12 1028 10/16/12 1232      Assessment:  57 y/o male who is to receive Vancomycin and Zosyn post-op repair of a perforated gastric ulcer.  CrCl ~ 51 ml/min.  WBC 16.3.  Tc Afebrile.  Goal of Therapy:   Vancomycin trough level 15-20 mcg/ml Antibiotics selected for infection/cultures and adjusted for renal function.   Plan:   Vancomycin 500 mg IV q 12 hours. Continue Zosyn 3.375 gm IV q 8 hours, each dose to infuse over 4 hours Continue Diflucan 200 mg IV q 24 hours.  Follow up SCr, UOP, cultures, clinical course and adjust as clinically indicated.   Gracieann Stannard, Elisha Headland,  Pharm.D.,    9/26/20148:22 PM

## 2012-10-16 NOTE — ED Notes (Signed)
Contacted OR, made aware that pt is ready for them. Pt to be transported to Short Stay.

## 2012-10-16 NOTE — Anesthesia Postprocedure Evaluation (Signed)
  Anesthesia Post-op Note  Patient: Drew Walters  Procedure(s) Performed: Procedure(s): EXPLORATORY LAPAROTOMY (N/A) REPAIR OF PERFORATED GASTRIC ULCER ,GRAHAM PATCH (N/A)  Patient Location: PACU  Anesthesia Type:General  Level of Consciousness: awake, alert  and oriented  Airway and Oxygen Therapy: Patient Spontanous Breathing and Patient connected to nasal cannula oxygen  Post-op Pain: mild  Post-op Assessment: Post-op Vital signs reviewed  Post-op Vital Signs: Reviewed  Complications: No apparent anesthesia complications

## 2012-10-16 NOTE — ED Notes (Signed)
Pt presents with generalized abdominal pain. Denies nvd, constipation. States it hurts his stomach when he breaths.

## 2012-10-16 NOTE — Transfer of Care (Signed)
Immediate Anesthesia Transfer of Care Note  Patient: Drew Walters  Procedure(s) Performed: Procedure(s): EXPLORATORY LAPAROTOMY (N/A) REPAIR OF PERFORATED GASTRIC ULCER ,GRAHAM PATCH (N/A)  Patient Location: PACU  Anesthesia Type:General  Level of Consciousness: lethargic and responds to stimulation  Airway & Oxygen Therapy: Patient Spontanous Breathing and Patient connected to nasal cannula oxygen  Post-op Assessment: Report given to PACU RN  Post vital signs: Reviewed and stable  Complications: No apparent anesthesia complications

## 2012-10-16 NOTE — ED Notes (Signed)
Pt completed drinking contrast.

## 2012-10-16 NOTE — Anesthesia Preprocedure Evaluation (Signed)
Anesthesia Evaluation  Patient identified by MRN, date of birth, ID band Patient awake    Reviewed: Allergy & Precautions, H&P , NPO status , Patient's Chart, lab work & pertinent test results  Airway Mallampati: II  Neck ROM: full    Dental   Pulmonary former smoker,          Cardiovascular negative cardio ROS      Neuro/Psych    GI/Hepatic H/o gastritis. Now has free air in abdomen.   Endo/Other    Renal/GU      Musculoskeletal   Abdominal   Peds  Hematology   Anesthesia Other Findings   Reproductive/Obstetrics                           Anesthesia Physical Anesthesia Plan  ASA: II and emergent  Anesthesia Plan: General   Post-op Pain Management:    Induction: Intravenous  Airway Management Planned: Oral ETT  Additional Equipment:   Intra-op Plan:   Post-operative Plan: Extubation in OR  Informed Consent: I have reviewed the patients History and Physical, chart, labs and discussed the procedure including the risks, benefits and alternatives for the proposed anesthesia with the patient or authorized representative who has indicated his/her understanding and acceptance.     Plan Discussed with: CRNA, Anesthesiologist and Surgeon  Anesthesia Plan Comments:         Anesthesia Quick Evaluation

## 2012-10-16 NOTE — H&P (Signed)
I saw the patient, participated in the history, exam and medical decision making, and concur with the physician assistant's note above.  Ct reviewed Labs reviewed  Alert, awake, appears ills cta  Reg Nd, a little firm, RUQ/epigastric TTP, +guarding.   Given  Clinical exam and ct findings I have recommended exp lap, possible bowel resection, ostomy. It appears suggestive of perforated duodenal ulcer; hopefully, graham patch and washout will be all that is needed.   Discussed with pt and family via lang line  Mary Sella. Andrey Campanile, MD, FACS General, Bariatric, & Minimally Invasive Surgery [facility], [place]

## 2012-10-17 LAB — BASIC METABOLIC PANEL
BUN: 16 mg/dL (ref 6–23)
CO2: 23 mEq/L (ref 19–32)
Calcium: 7.7 mg/dL — ABNORMAL LOW (ref 8.4–10.5)
Chloride: 104 mEq/L (ref 96–112)
Creatinine, Ser: 1.41 mg/dL — ABNORMAL HIGH (ref 0.50–1.35)
GFR calc Af Amer: 62 mL/min — ABNORMAL LOW (ref >90)
Glucose, Bld: 127 mg/dL — ABNORMAL HIGH (ref 70–99)
Potassium: 4.7 mEq/L (ref 3.5–5.1)

## 2012-10-17 LAB — CBC
HCT: 30.5 % — ABNORMAL LOW (ref 39.0–52.0)
Hemoglobin: 10 g/dL — ABNORMAL LOW (ref 13.0–17.0)
MCHC: 32.8 g/dL (ref 30.0–36.0)
RDW: 15.9 % — ABNORMAL HIGH (ref 11.5–15.5)

## 2012-10-17 MED ORDER — WHITE PETROLATUM GEL
Status: AC
  Administered 2012-10-17: 0.2
  Filled 2012-10-17: qty 5

## 2012-10-17 NOTE — Op Note (Signed)
NAMEAMONTE, BROOKOVER NO.:  [id number]  MEDICAL RECORD NO.:  [id number]  LOCATION:  [id number]                        FACILITY:  [facility]  PHYSICIAN:  Mary Sella. Andrey Campanile, MD, FACSDATE OF BIRTH:  1955/12/31  DATE OF PROCEDURE:  10/16/2012 DATE OF DISCHARGE:                              OPERATIVE REPORT   PREOPERATIVE DIAGNOSIS:  Free intra-abdominal air.  POSTOPERATIVE DIAGNOSIS:  Perforated distal gastric ulcer.  PROCEDURES: 1. Exploratory laparotomy. 2. Repair of perforated gastric ulcer with Cheree Ditto patch.  SURGEON:  Mary Sella. Andrey Campanile, MD, FACS.  PHYSICIAN ASSISTANT:  Ashok Norris, Nurse Practitioner.  ANESTHESIA:  General.  ESTIMATED BLOOD LOSS:  Minimal.  DRAINS:  A 19-French Blake drain in the right upper quadrant in the gallbladder fossa overlying D1 and distal stomach.  INDICATIONS FOR PROCEDURE:  The patient is a [place] gentleman [place].  He had a history of gastritis.  He awoke last night with acute onset of upper abdominal pain, which made him short of breath.  He came to the emergency room, where he developed nausea and emesis.  He had a CT scan, which demonstrated duodenal thickening as well as free intra-abdominal air and elevated white count.  He was also hyperkalemic.  His hyperkalemia was treated and I recommended exploratory laparotomy based on the findings. We discussed the risks and benefits of surgery including, but not limited to, bleeding, infection, injury to surrounding structures, need for additional procedures, abscess formation, incisional hernia, wound infection, blood clot formation, fistula formation, recurrence of the problem, prolonged hospitalization.  He elected to proceed to the operating room.  This was all done via the language line.  DESCRIPTION OF PROCEDURE:  After obtaining informed consent, the patient was taken to the operating room 1 at [facility], placed supine  on the operating table.  General endotracheal anesthesia was established.  Sequential compression devices were placed.  A Foley catheter was placed.  His abdomen was prepped and draped in usual standard surgical fashion.  He received IV antibiotics in the ER.  A surgical time-out was performed.  An upper midline incision was made sharply with a #10 blade and carried around his umbilicus.  Subcutaneous tissue was divided with electrocautery.  The fascia was divided and the abdominal cavity was carefully entered.  There was gross pus in the right upper quadrant.  The proximal stomach was inspected and upon looking around the gallbladder, D1, and the distal stomach, it became quite obvious that the patient had hole at the pylorus.  The perforation was approximately 2-3 mm in size.  There was a fair amount of pus in the right pericolic gutter and about the liver.  The abdominal cavity was irrigated with 4 L of saline.  I then closed the perforation primarily with 2 interrupted 3-0 silk sutures.  Because the patient was so thin, he really did not have a lot of omentum.  I ended up taking down some of the omentum off the transverse colon, and bringing it up, and then using 2-0 silk sutures I laid it over the area of repair and then tacked it down with three 2-0  silk sutures.  I then placed Evicel over the patch repair.  A small incision was made in the right upper quadrant, and the tonsil was brought through the abdominal wall and a 19-French Blake drain was brought out through the right lateral abdominal wall.  The drain was trimmed and placed in the right upper quadrant in the right pericolic gutter with the tip extending over the perforated ulcer repair site.  We confirmed NG tube placement in the stomach.  The abdomen was then closed with looped #1 PDS one from above and one from below.  The skin was left open and packed with  moist 4 x 4, followed by dry gauze, and sterile dressing.  The  patient was extubated and taken to recovery room in stable condition.  There were no immediate complications.  The patient tolerated the procedure well.     Mary Sella. Andrey Campanile, MD, FACS     EMW/MEDQ  D:  10/16/2012  T:  10/17/2012  Job:  [id number]

## 2012-10-17 NOTE — Progress Notes (Signed)
1 Day Post-Op   Assessment: s/p Procedure(s): EXPLORATORY LAPAROTOMY REPAIR OF PERFORATED GASTRIC ULCER ,GRAHAM PATCH Patient Active Problem List   Diagnosis Date Noted  . Pneumoperitoneum 10/16/2012  . Hyperkalemia 10/16/2012  . Leukocytosis 10/16/2012  . H/O gastritis 10/16/2012    Stable one day post op Occasional PVC, unifocal, improved from yesterday  Plan Continue antibioics due to pre op infection Foley out in AM Encourage ambulation and IS  Subjective: Sore in incision, but less pain than pre op or post op yesterday  Objective: Vital signs in last 24 hours: Temp:  [97.5 F (36.4 C)-99 F (37.2 C)] 98.6 F (37 C) (09/27 0657) Pulse Rate:  [66-120] 98 (09/27 0657) Resp:  [15-31] 18 (09/27 0657) BP: (107-154)/(65-86) 133/74 mmHg (09/27 0657) SpO2:  [92 %-100 %] 96 % (09/27 0657) Weight:  [124 lb 9 oz (56.5 kg)] 124 lb 9 oz (56.5 kg) (09/26 2018)   Intake/Output from previous day: 09/26 0701 - 09/27 0700 In: 2536 [I.V.:2536] Out: 1094 [Urine:1025; Drains:69]  General appearance: alert, cooperative, fatigued and no distress Resp: clear to auscultation bilaterally Cardio: regular rate and rhythm, S1, S2 normal, no murmur, click, rub or gallop and did not hear any premature beats GI: Soft, tender along incision, no BS,drain thin, no bile  Incision: Dressing dry, did not remove today  Lab Results:   Recent Labs  10/16/12 0730 10/17/12 0510  WBC 16.3* 12.6*  HGB 12.2* 10.0*  HCT 36.7* 30.5*  PLT 117* 87*   BMET  Recent Labs  10/16/12 1647 10/17/12 0510  NA 131* 135  K 4.9 4.7  CL 101 104  CO2 21 23  GLUCOSE 119* 127*  BUN 17 16  CREATININE 1.20 1.41*  CALCIUM 8.2* 7.7*    MEDS, Scheduled . enoxaparin (LOVENOX) injection  40 mg Subcutaneous Q24H  . fluconazole (DIFLUCAN) IV  200 mg Intravenous Q24H  . ondansetron  4 mg Intravenous Q6H  . pantoprazole (PROTONIX) IV  40 mg Intravenous Q12H  . piperacillin-tazobactam (ZOSYN)  IV  3.375 g  Intravenous Q8H  . vancomycin (VANCOCIN) 500 mg IVPB  500 mg Intravenous Q12H    Studies/Results: Ct Abdomen Pelvis W Contrast  10/16/2012   CLINICAL DATA:  Diffuse nonspecific abdominal pain.  EXAM: CT ABDOMEN AND PELVIS WITH CONTRAST  TECHNIQUE: Multidetector CT imaging of the abdomen and pelvis was performed using the standard protocol following bolus administration of intravenous contrast.  CONTRAST:  80mL OMNIPAQUE IOHEXOL 300 MG/ML  SOLN  COMPARISON:  None available.  FINDINGS: Bilateral lower lobe airspace disease is evident with diffuse interstitial coarsening. No significant consolidation is identified. The heart size is normal. No significant pleural or pericardial effusion is evident.  The proximal stomach is dilated with a fluid level of contrast. There is a relative obstruction with inflammatory changes involving the 2nd portion of the duodenum. The more distal duodenum and the pancreas are within normal limits.  The liver and spleen are normal. Common bile duct and gallbladder are unremarkable. The adrenal glands are within normal limits.  Pneumoperitoneum there is evident with gas along the anterior peritoneal wall and within Morrison's pouch. A 9 mm cyst is present at the upper pole of the left kidney. The kidneys and ureters are otherwise within normal limits.  The rectosigmoid colon is within normal limits. There is extensive fluid surrounding the cecum and in the right pericolic gutter. Inflammatory changes are present at the hepatic flexure.  Stranding is present within the small bowel mesenteric. No focal small bowel  dilation or mass is evident. Atherosclerotic calcifications are present in the aorta and branch vessels without aneurysm.  Bone windows demonstrate no focal lytic or blastic lesions.  IMPRESSION: 1. Pneumoperitoneum suggesting bowel perforation. 2. Inflammatory changes about the hepatic flexure of the colon and 2nd portion of the duodenum. This likely represents a perforated  diverticulum of the duodenum which appears more inflamed than the colon. 3. Relative outlet obstruction of the stomach is likely secondary to the duodenum inflammatory changes. 4. Interstitial coarsening and airspace disease at the lung bases bilaterally likely represents edema. No significant airspace consolidation is evident. CriticalValue/emergent results were called by telephone at the time of interpretation on 10/16/2012 at 10:25 AMto Dr. Margarita Grizzle , who verbally acknowledged these results.   Electronically Signed   By: Gennette Pac   On: 10/16/2012 10:28   Dg Chest Port 1 View  10/16/2012   CLINICAL DATA:  Chest and abdominal pain.  EXAM: PORTABLE CHEST - 1 VIEW  COMPARISON:  None.  FINDINGS: The patient is rotated to the right on today's radiograph, reducing diagnostic sensitivity and specificity. Low lung volumes are present, causing crowding of the pulmonary vasculature. Borderline cardiomegaly, which may be exaggerated by the low lung volumes. Linear opacity/interstitial accentuation at the lung bases. No blunting of the costophrenic angles observed.  IMPRESSION: 1. Low lung volumes with linear opacities at the lung bases favoring subsegmental atelectasis over a basilar interstitial edema process.   Electronically Signed   By: Herbie Baltimore   On: 10/16/2012 07:56      LOS: 1 day     Currie Paris, MD, [facility], [place] [phone]   10/17/2012 9:26 AM

## 2012-10-18 ENCOUNTER — Encounter (HOSPITAL_COMMUNITY): Payer: Self-pay | Admitting: Surgery

## 2012-10-18 DIAGNOSIS — I493 Ventricular premature depolarization: Secondary | ICD-10-CM | POA: Diagnosis present

## 2012-10-18 LAB — CBC
HCT: 29.1 % — ABNORMAL LOW (ref 39.0–52.0)
Platelets: 79 10*3/uL — ABNORMAL LOW (ref 150–400)
RBC: 3.81 MIL/uL — ABNORMAL LOW (ref 4.22–5.81)
RDW: 16.1 % — ABNORMAL HIGH (ref 11.5–15.5)
WBC: 11.6 10*3/uL — ABNORMAL HIGH (ref 4.0–10.5)

## 2012-10-18 LAB — BASIC METABOLIC PANEL
BUN: 10 mg/dL (ref 6–23)
Calcium: 7.9 mg/dL — ABNORMAL LOW (ref 8.4–10.5)
Chloride: 104 mEq/L (ref 96–112)
Creatinine, Ser: 1.43 mg/dL — ABNORMAL HIGH (ref 0.50–1.35)
GFR calc Af Amer: 60 mL/min — ABNORMAL LOW (ref >90)
Sodium: 134 mEq/L — ABNORMAL LOW (ref 135–145)

## 2012-10-18 LAB — CK TOTAL AND CKMB (NOT AT ARMC)
CK, MB: 3.3 ng/mL (ref 0.3–4.0)
Relative Index: 0.9 (ref 0.0–2.5)

## 2012-10-18 LAB — TSH: TSH: 2.788 u[IU]/mL (ref 0.350–4.500)

## 2012-10-18 MED ORDER — HYDROMORPHONE HCL PF 1 MG/ML IJ SOLN
2.0000 mg | INTRAMUSCULAR | Status: DC | PRN
  Administered 2012-10-18 (×2): 1 mg via INTRAVENOUS
  Administered 2012-10-18: 2 mg via INTRAVENOUS
  Administered 2012-10-18: 1 mg via INTRAVENOUS
  Administered 2012-10-19: 2 mg via INTRAVENOUS
  Administered 2012-10-19: 1 mg via INTRAVENOUS
  Administered 2012-10-19 – 2012-10-23 (×18): 2 mg via INTRAVENOUS
  Filled 2012-10-18 (×23): qty 2

## 2012-10-18 MED ORDER — BIOTENE DRY MOUTH MT LIQD
15.0000 mL | Freq: Two times a day (BID) | OROMUCOSAL | Status: DC
  Administered 2012-10-18 – 2012-10-25 (×12): 15 mL via OROMUCOSAL

## 2012-10-18 NOTE — Significant Event (Signed)
Rapid Response Event Note:  called by bedside nurse d/t trigeminy being noted on tele  Overview: Time Called: 0210 Arrival Time: 0215 Event Type: Cardiac  Initial Focused Assessment: pt. VSS, afebrile, no c/o, denies any CP or SOB,    Interventions: stat EKG - SR w/ PVC's, no trigeminy at this time    Event Summary: pt. Resting comfortably, no longer having trigeminy but freq. PVC's, CE, CBC and CMET ordered bt Dr. Carolynne Edouard. Name of Physician Notified: Dr. Carolynne Edouard at 0230    at 0225  Outcome: Stayed in room and stabalized  Event End Time: 0245  Mallie Darting

## 2012-10-18 NOTE — Progress Notes (Addendum)
2 Days Post-Op   Assessment: s/p Procedure(s): EXPLORATORY LAPAROTOMY REPAIR OF PERFORATED GASTRIC ULCER ,GRAHAM PATCH Patient Active Problem List   Diagnosis Date Noted  . Pneumoperitoneum 10/16/2012  . Hyperkalemia 10/16/2012  . Leukocytosis 10/16/2012  . H/O gastritis 10/16/2012  . Gastric ulcer with perforation 10/16/2012    Stable post op. Had trigeminy for a brief time yesterday, stable now, ekg showed some PVC's. Creat slt up from pre-op  Plan: Will try to adjust pain meds. Ask for cards eval repeat labs in am  Subjective: Feels better than yesterday, but pain meds wear off in an hour. No nausea  Objective: Vital signs in last 24 hours: Temp:  [98.2 F (36.8 C)-98.6 F (37 C)] 98.2 F (36.8 C) (09/28 0606) Pulse Rate:  [90-109] 109 (09/28 0606) Resp:  [17-19] 18 (09/28 0606) BP: (134-146)/(69-79) 139/71 mmHg (09/28 0606) SpO2:  [94 %-97 %] 94 % (09/28 0606)   Intake/Output from previous day: 09/27 0701 - 09/28 0700 In: 3043 [I.V.:3043] Out: 1930 [Urine:1625; Emesis/NG output:110; Drains:195]  General appearance: alert, cooperative, fatigued and no distress Resp: clear to auscultation bilaterally Cardio: regular rate and rhythm, S1, S2 normal, no murmur, click, rub or gallop GI: Slightly distended, soft, tender at incision. Bile in NG tube  Incision: DDI  wil check when back in bed. JP thin, NO bile  Lab Results:   Recent Labs  10/17/12 0510 10/18/12 0348  WBC 12.6* 11.6*  HGB 10.0* 9.8*  HCT 30.5* 29.1*  PLT 87* 79*   BMET  Recent Labs  10/17/12 0510 10/18/12 0348  NA 135 134*  K 4.7 4.4  CL 104 104  CO2 23 23  GLUCOSE 127* 134*  BUN 16 10  CREATININE 1.41* 1.43*  CALCIUM 7.7* 7.9*    MEDS, Scheduled . enoxaparin (LOVENOX) injection  40 mg Subcutaneous Q24H  . fluconazole (DIFLUCAN) IV  200 mg Intravenous Q24H  . ondansetron  4 mg Intravenous Q6H  . pantoprazole (PROTONIX) IV  40 mg Intravenous Q12H  . piperacillin-tazobactam  (ZOSYN)  IV  3.375 g Intravenous Q8H  . vancomycin (VANCOCIN) 500 mg IVPB  500 mg Intravenous Q12H    Studies/Results: Ct Abdomen Pelvis W Contrast  10/16/2012   CLINICAL DATA:  Diffuse nonspecific abdominal pain.  EXAM: CT ABDOMEN AND PELVIS WITH CONTRAST  TECHNIQUE: Multidetector CT imaging of the abdomen and pelvis was performed using the standard protocol following bolus administration of intravenous contrast.  CONTRAST:  80mL OMNIPAQUE IOHEXOL 300 MG/ML  SOLN  COMPARISON:  None available.  FINDINGS: Bilateral lower lobe airspace disease is evident with diffuse interstitial coarsening. No significant consolidation is identified. The heart size is normal. No significant pleural or pericardial effusion is evident.  The proximal stomach is dilated with a fluid level of contrast. There is a relative obstruction with inflammatory changes involving the 2nd portion of the duodenum. The more distal duodenum and the pancreas are within normal limits.  The liver and spleen are normal. Common bile duct and gallbladder are unremarkable. The adrenal glands are within normal limits.  Pneumoperitoneum there is evident with gas along the anterior peritoneal wall and within Morrison's pouch. A 9 mm cyst is present at the upper pole of the left kidney. The kidneys and ureters are otherwise within normal limits.  The rectosigmoid colon is within normal limits. There is extensive fluid surrounding the cecum and in the right pericolic gutter. Inflammatory changes are present at the hepatic flexure.  Stranding is present within the small bowel mesenteric. No  focal small bowel dilation or mass is evident. Atherosclerotic calcifications are present in the aorta and branch vessels without aneurysm.  Bone windows demonstrate no focal lytic or blastic lesions.  IMPRESSION: 1. Pneumoperitoneum suggesting bowel perforation. 2. Inflammatory changes about the hepatic flexure of the colon and 2nd portion of the duodenum. This likely  represents a perforated diverticulum of the duodenum which appears more inflamed than the colon. 3. Relative outlet obstruction of the stomach is likely secondary to the duodenum inflammatory changes. 4. Interstitial coarsening and airspace disease at the lung bases bilaterally likely represents edema. No significant airspace consolidation is evident. CriticalValue/emergent results were called by telephone at the time of interpretation on 10/16/2012 at 10:25 AMto Dr. Margarita Grizzle , who verbally acknowledged these results.   Electronically Signed   By: Gennette Pac   On: 10/16/2012 10:28      LOS: 2 days     Currie Paris, MD, [facility], [place] [phone]   10/18/2012 8:35 AM

## 2012-10-18 NOTE — Consult Note (Signed)
Cardiology Consult Note  Admit date: 10/16/2012 Name: Drew Walters 57 y.o.  male DOB:  Apr 21, 1955 MRN:  [id number]  Today's date:  10/18/2012  Referring Physician:    Dr. Cicero Duck   Reason for Consultation:    Premature ventricular contractions  IMPRESSIONS:  1. Premature ventricular contractions that are not symptomatic in a patient with no previous cardiac history noted on telemetry 2. Previous perforation of gastric ulcer 3. Renal insufficiency that may be transient but will need to follow  RECOMMENDATION: 1. Obtain echocardiogram to be sure he has normal LV function 2. He is asymptomatic and I think his telemetry could be discontinued. Normally premature ventricular contractions by themselves in the absence of other cardiac symptoms do not need to be treated. 3. Obtain TSH  HISTORY: This 57 year old male moved here 2 weeks ago from [place] and presented with a perforated ulcer and is now postoperative. The history is obtained through a translator. He feels fine at the present time denies chest pain, shortness of breath or palpitations. PVCs were noted on telemetry last night and this morning. He has no prior cardiac history. There's no history of hypertension or thyroid disease.  Past Medical History  Diagnosis Date  . Gastritis   . Gastric ulcer with perforation 10/16/2012    Operated on 10/16/12, Cheree Ditto patch      Past Surgical History  Procedure Laterality Date  . Repair of perforated ulcer       Allergies:  has No Known Allergies.   Medications: Prior to Admission medications   Medication Sig Start Date End Date Taking? Authorizing Provider  bismuth subsalicylate (PEPTO BISMOL) 262 MG/15ML suspension Take 5 mLs by mouth every 6 (six) hours as needed for indigestion.   Yes Historical Provider, MD   Family History: No family status information on file.    Social History:   reports that he has quit smoking. He has never used smokeless tobacco. He reports that he  does not drink alcohol or use illicit drugs.   History   Social History Narrative   Moved here from [place].   Review of Systems: Other than as noted above unremarkable  Physical Exam: BP 139/71  Pulse 109  Temp(Src) 98.2 F (36.8 C) (Oral)  Resp 18  Ht 5\' 2"  (1.575 m)  Wt 56.5 kg (124 lb 9 oz)  BMI 22.78 kg/m2  SpO2 94%  General appearance: Pleasant [place] male who is currently in no acute distress with an NG tube in place Head: Normocephalic, without obvious abnormality, atraumatic Eyes: conjunctivae/corneas clear. PERRL, EOM's intact. Fundi benign. Neck: no adenopathy, no carotid bruit, no JVD and supple, symmetrical, trachea midline Lungs: clear to auscultation bilaterally Heart: regular rate and rhythm, S1, S2 normal, no murmur, click, rub or gallop Abdomen: Abdomen slightly distended soft, bowel sounds reduced Rectal: deferred Pulses: 2+ and symmetric Skin: Skin color, texture, turgor normal. No rashes or lesions Neurologic: Grossly normal  Labs: CBC  Recent Labs  10/16/12 0730  10/18/12 0348  WBC 16.3*  < > 11.6*  RBC 4.74  < > 3.81*  HGB 12.2*  < > 9.8*  HCT 36.7*  < > 29.1*  PLT 117*  < > 79*  MCV 77.4*  < > 76.4*  MCH 25.7*  < > 25.7*  MCHC 33.2  < > 33.7  RDW 16.1*  < > 16.1*  LYMPHSABS 0.6*  --   --   MONOABS 1.8*  --   --   EOSABS 0.0  --   --  BASOSABS 0.0  --   --   < > = values in this interval not displayed. CMP   Recent Labs  10/16/12 0730  10/18/12 0348  NA 133*  < > 134*  K 6.9*  < > 4.4  CL 102  < > 104  CO2 22  < > 23  GLUCOSE 97  < > 134*  BUN 19  < > 10  CREATININE 1.31  < > 1.43*  CALCIUM 8.7  < > 7.9*  PROT 7.8  --   --   ALBUMIN 4.0  --   --   AST 27  --   --   ALT 35  --   --   ALKPHOS 76  --   --   BILITOT 1.3*  --   --   GFRNONAA 58*  < > 52*  GFRAA 67*  < > 60*  < > = values in this interval not displayed.  Cardiac Panel (last 3 results)  Recent Labs  10/18/12 0348  CKTOTAL 370*  CKMB 3.3  RELINDX  0.9     Radiology: Atelectasis in the bases  EKG: Normal EKG except for PVCs  Signed:  W. Ashley Royalty MD [facility]   Cardiology Consultant  10/18/2012, 11:55 AM

## 2012-10-19 MED ORDER — PHENOL 1.4 % MT LIQD: 1.0000 | OROMUCOSAL | Status: DC | PRN

## 2012-10-19 NOTE — Progress Notes (Signed)
Patient ID: Jude Naclerio, male   DOB: 01-29-1955, 57 y.o.   MRN: [id number] 3 Days Post-Op  Subjective: Pt feels ok.  Appropriate c/o pain.  No flatus  Objective: Vital signs in last 24 hours: Temp:  [98.2 F (36.8 C)-99.7 F (37.6 C)] 98.2 F (36.8 C) (09/29 0609) Pulse Rate:  [90-106] 90 (09/29 0609) Resp:  [18] 18 (09/29 0609) BP: (138-150)/(75-84) 150/84 mmHg (09/29 0609) SpO2:  [92 %-96 %] 96 % (09/29 0609) Last BM Date: 10/15/12  Intake/Output from previous day: 09/28 0701 - 09/29 0700 In: 2819.5 [I.V.:2819.5] Out: 2310 [Urine:2050; Emesis/NG output:140; Drains:120] Intake/Output this shift:    PE: Abd: soft, appropriately tender, hypoactive BS, wound is clean and packed.  JP drain with serosang output.  NG with minimal bilious output. Heart: frequent PVCs  Lab Results:   Recent Labs  10/17/12 0510 10/18/12 0348  WBC 12.6* 11.6*  HGB 10.0* 9.8*  HCT 30.5* 29.1*  PLT 87* 79*   BMET  Recent Labs  10/17/12 0510 10/18/12 0348  NA 135 134*  K 4.7 4.4  CL 104 104  CO2 23 23  GLUCOSE 127* 134*  BUN 16 10  CREATININE 1.41* 1.43*  CALCIUM 7.7* 7.9*   PT/INR No results found for this basename: LABPROT, INR,  in the last 72 hours CMP     Component Value Date/Time   NA 134* 10/18/2012 0348   K 4.4 10/18/2012 0348   CL 104 10/18/2012 0348   CO2 23 10/18/2012 0348   GLUCOSE 134* 10/18/2012 0348   BUN 10 10/18/2012 0348   CREATININE 1.43* 10/18/2012 0348   CALCIUM 7.9* 10/18/2012 0348   PROT 7.8 10/16/2012 0730   ALBUMIN 4.0 10/16/2012 0730   AST 27 10/16/2012 0730   ALT 35 10/16/2012 0730   ALKPHOS 76 10/16/2012 0730   BILITOT 1.3* 10/16/2012 0730   GFRNONAA 52* 10/18/2012 0348   GFRAA 60* 10/18/2012 0348   Lipase     Component Value Date/Time   LIPASE 24 10/16/2012 0730       Studies/Results: No results found.  Anti-infectives: Anti-infectives   Start     Dose/Rate Route Frequency Ordered Stop   10/16/12 2100  vancomycin (VANCOCIN) 500 mg in sodium  chloride 0.9 % 100 mL IVPB  Status:  Discontinued     500 mg 100 mL/hr over 60 Minutes Intravenous Every 12 hours 10/16/12 2032 10/19/12 0946   10/16/12 2000  piperacillin-tazobactam (ZOSYN) IVPB 3.375 g     3.375 g 12.5 mL/hr over 240 Minutes Intravenous Every 8 hours 10/16/12 1943     10/16/12 2000  fluconazole (DIFLUCAN) IVPB 200 mg     200 mg 100 mL/hr over 60 Minutes Intravenous Every 24 hours 10/16/12 1943     10/16/12 1030  [MAR Hold]  piperacillin-tazobactam (ZOSYN) IVPB 3.375 g     (On MAR Hold since 10/16/12 1245)   3.375 g 12.5 mL/hr over 240 Minutes Intravenous  Once 10/16/12 1028 10/16/12 1633   10/16/12 1030  vancomycin (VANCOCIN) IVPB 1000 mg/200 mL premix     1,000 mg 200 mL/hr over 60 Minutes Intravenous  Once 10/16/12 1028 10/16/12 1232       Assessment/Plan  1.  S/p repair of perforated distal gastric ulcer 2. Frequent PVCs 3. ARI  Plan: 1. Cont NGT today.  Will get an UGI tomorrow to evaluate for a leak 2. Dc Vanc, cont zosyn and fluconazole 3. Follow Cr, IVFs increased yesterday and dc vanc today.  Will follow with am labs  4. TSH normal 5. Await echo.  Appreciate cards input. 6. Mobilize and pulm toilet 7. Cont dressing changes BID  LOS: 3 days    Santonio Speakman E 10/19/2012, 9:48 AM Pager: [phone]

## 2012-10-19 NOTE — Progress Notes (Signed)
Upper GI tomorrow.  Possible d/c NG tube if study negative.  Wilmon Arms. Corliss Skains, MD, [facility]  General/ Trauma Surgery  10/19/2012 3:14 PM

## 2012-10-19 NOTE — Progress Notes (Signed)
  Echocardiogram 2D Echocardiogram has been performed.  Drew Walters [name] 10/19/2012, 3:26 PM

## 2012-10-20 ENCOUNTER — Encounter (HOSPITAL_COMMUNITY): Payer: Self-pay | Admitting: General Surgery

## 2012-10-20 ENCOUNTER — Inpatient Hospital Stay (HOSPITAL_COMMUNITY): Payer: Medicaid Other

## 2012-10-20 LAB — CBC
HCT: 25.7 % — ABNORMAL LOW (ref 39.0–52.0)
Hemoglobin: 8.7 g/dL — ABNORMAL LOW (ref 13.0–17.0)
MCH: 25.1 pg — ABNORMAL LOW (ref 26.0–34.0)
MCHC: 33.9 g/dL (ref 30.0–36.0)
MCV: 74.1 fL — ABNORMAL LOW (ref 78.0–100.0)
Platelets: 102 K/uL — ABNORMAL LOW (ref 150–400)
RBC: 3.47 MIL/uL — ABNORMAL LOW (ref 4.22–5.81)
RDW: 15.1 % (ref 11.5–15.5)
WBC: 9.8 K/uL (ref 4.0–10.5)

## 2012-10-20 LAB — BASIC METABOLIC PANEL WITH GFR
BUN: 4 mg/dL — ABNORMAL LOW (ref 6–23)
CO2: 26 meq/L (ref 19–32)
Calcium: 8.1 mg/dL — ABNORMAL LOW (ref 8.4–10.5)
Chloride: 105 meq/L (ref 96–112)
Creatinine, Ser: 1.22 mg/dL (ref 0.50–1.35)
GFR calc Af Amer: 73 mL/min — ABNORMAL LOW
GFR calc non Af Amer: 63 mL/min — ABNORMAL LOW
Glucose, Bld: 113 mg/dL — ABNORMAL HIGH (ref 70–99)
Potassium: 3.8 meq/L (ref 3.5–5.1)
Sodium: 138 meq/L (ref 135–145)

## 2012-10-20 MED ORDER — IOHEXOL 300 MG/ML  SOLN
150.0000 mL | Freq: Once | INTRAMUSCULAR | Status: AC | PRN
  Administered 2012-10-20: 100 mL via ORAL

## 2012-10-20 NOTE — Progress Notes (Signed)
4 Days Post-Op  Subjective: Patient just up from radiology, with some increased nausea while in radiology and complaining of no gas or BM today and feeling bloated. Denies chest pain, shortness of breath, abdominal pain.  Objective: Vital signs in last 24 hours: Temp:  [98.4 F (36.9 C)] 98.4 F (36.9 C) (09/30 0455) Pulse Rate:  [75-91] 75 (09/30 0455) Resp:  [17-18] 17 (09/30 0455) BP: (138-147)/(67-75) 138/67 mmHg (09/30 0455) SpO2:  [97 %] 97 % (09/30 0455) Last BM Date:  (Prior to admission)  Intake/Output from previous day: 09/29 0701 - 09/30 0700 In: 2052.1 [I.V.:1852.1; IV Piggyback:200] Out: 870 [Urine:650; Emesis/NG output:100; Drains:120] Intake/Output this shift: Total I/O In: 341.7 [I.V.:341.7] Out: 95 [Emesis/NG output:25; Drains:70]  PE: Abd: Soft, nontender, mildly distended and tympanic, BS present but hypoactive, JP draining serosanguinous fluid with possible green tint at fluid in tube. Pulm: normal effort HEENT: NG in place draining bilious gastric contents  Lab Results:   Recent Labs  10/18/12 0348 10/20/12 0640  WBC 11.6* 9.8  HGB 9.8* 8.7*  HCT 29.1* 25.7*  PLT 79* 102*   BMET  Recent Labs  10/18/12 0348 10/20/12 0640  NA 134* 138  K 4.4 3.8  CL 104 105  CO2 23 26  GLUCOSE 134* 113*  BUN 10 4*  CREATININE 1.43* 1.22  CALCIUM 7.9* 8.1*   PT/INR No results found for this basename: LABPROT, INR,  in the last 72 hours CMP     Component Value Date/Time   NA 138 10/20/2012 0640   K 3.8 10/20/2012 0640   CL 105 10/20/2012 0640   CO2 26 10/20/2012 0640   GLUCOSE 113* 10/20/2012 0640   BUN 4* 10/20/2012 0640   CREATININE 1.22 10/20/2012 0640   CALCIUM 8.1* 10/20/2012 0640   PROT 7.8 10/16/2012 0730   ALBUMIN 4.0 10/16/2012 0730   AST 27 10/16/2012 0730   ALT 35 10/16/2012 0730   ALKPHOS 76 10/16/2012 0730   BILITOT 1.3* 10/16/2012 0730   GFRNONAA 63* 10/20/2012 0640   GFRAA 73* 10/20/2012 0640   Lipase     Component Value Date/Time   LIPASE 24 10/16/2012 0730       Studies/Results: No results found.  Anti-infectives: Anti-infectives   Start     Dose/Rate Route Frequency Ordered Stop   10/16/12 2100  vancomycin (VANCOCIN) 500 mg in sodium chloride 0.9 % 100 mL IVPB  Status:  Discontinued     500 mg 100 mL/hr over 60 Minutes Intravenous Every 12 hours 10/16/12 2032 10/19/12 0946   10/16/12 2000  piperacillin-tazobactam (ZOSYN) IVPB 3.375 g     3.375 g 12.5 mL/hr over 240 Minutes Intravenous Every 8 hours 10/16/12 1943     10/16/12 2000  fluconazole (DIFLUCAN) IVPB 200 mg     200 mg 100 mL/hr over 60 Minutes Intravenous Every 24 hours 10/16/12 1943     10/16/12 1030  [MAR Hold]  piperacillin-tazobactam (ZOSYN) IVPB 3.375 g     (On MAR Hold since 10/16/12 1245)   3.375 g 12.5 mL/hr over 240 Minutes Intravenous  Once 10/16/12 1028 10/16/12 1633   10/16/12 1030  vancomycin (VANCOCIN) IVPB 1000 mg/200 mL premix     1,000 mg 200 mL/hr over 60 Minutes Intravenous  Once 10/16/12 1028 10/16/12 1232       Assessment/Plan 1. S/p repair of perforated distal gastric ulcer 2. Frequent PVCs 3. ARI  Plan: - Afebrile and stable, but with some increased bloated feeling and NG continues putting out bilious gastric fluid -  Continuing NG tube for now, pending radiologic read of UGI to evaluate for leak, also with mildly distended tympanic abdomen - Pt on zosyn and fluconazole - Hgb down 1 gram today, mild blood-tinged fluid in JP drain but no other obvious source of bleeding; Recheck CBC tomorrow morning - Cr improving today    LOS: 4 days    Leona Singleton, MD 10/20/2012, 10:45 AM Pager: [phone]

## 2012-10-20 NOTE — Progress Notes (Addendum)
ECHO shows normal systolic function.  No need for further w/u of PVC's at this point and no treatment necessary.  Darden Palmer MD [facility]

## 2012-10-20 NOTE — Progress Notes (Signed)
The patient still reports no flatus or BM. Study showed no sign of leak or obstruction. Awaiting bowel function  Otherwise doing well.  Wilmon Arms. Corliss Skains, MD, [facility] Surgery  General/ Trauma Surgery  10/20/2012 1:42 PM

## 2012-10-21 DIAGNOSIS — N289 Disorder of kidney and ureter, unspecified: Secondary | ICD-10-CM

## 2012-10-21 LAB — CBC
HCT: 25.2 % — ABNORMAL LOW (ref 39.0–52.0)
Hemoglobin: 8.4 g/dL — ABNORMAL LOW (ref 13.0–17.0)
MCHC: 33.3 g/dL (ref 30.0–36.0)
MCV: 75.4 fL — ABNORMAL LOW (ref 78.0–100.0)
Platelets: 133 10*3/uL — ABNORMAL LOW (ref 150–400)
RBC: 3.34 MIL/uL — ABNORMAL LOW (ref 4.22–5.81)
WBC: 8.5 10*3/uL (ref 4.0–10.5)

## 2012-10-21 NOTE — Progress Notes (Signed)
5 Days Post-Op  Subjective: Pt wants NGT out.  +flatus, no bm. Not ambulating much, lying supine in bed.  +cough with clear secretions.    Objective: Vital signs in last 24 hours: Temp:  [97.6 F (36.4 C)-98.5 F (36.9 C)] 98.5 F (36.9 C) (10/01 0607) Pulse Rate:  [80-89] 89 (10/01 0607) Resp:  [16-17] 17 (10/01 0607) BP: (148-164)/(79-84) 148/84 mmHg (10/01 0607) SpO2:  [94 %-97 %] 96 % (10/01 0607) Last BM Date: 10/16/12  Intake/Output from previous day: 09/30 0701 - 10/01 0700 In: 3154.9 [I.V.:3104.9; IV Piggyback:50] Out: 1440 [Urine:950; Emesis/NG output:205; Drains:285] Intake/Output this shift:    PE:  Abd: Soft, nontender,non distended, BS present, JP draining serous output. Pulm: IS .  CTA Cardio: s1s2 rrr, no murmurs, gallops or rubs.   Lab Results:   Recent Labs  10/20/12 0640 10/21/12 0502  WBC 9.8 8.5  HGB 8.7* 8.4*  HCT 25.7* 25.2*  PLT 102* 133*   BMET  Recent Labs  10/20/12 0640  NA 138  K 3.8  CL 105  CO2 26  GLUCOSE 113*  BUN 4*  CREATININE 1.22  CALCIUM 8.1*   Studies/Results: Dg Ugi W/water Sol Cm  10/20/2012   CLINICAL DATA:  Patient status post repair of perforated ulcer repair.  EXAM: ESOPHOGRAM/BARIUM SWALLOW  TECHNIQUE: Single contrast examination was performed using water-soluble contrast.  COMPARISON:  CT exam 10/16/2012  FLUOROSCOPY TIME:  1 min 45 seconds  FINDINGS: Initial scout images demonstrates a NG tube within the stomach. Surgical drain in the mid abdomen from a right-sided approach. Hand injection of 100 cc of water-soluble contrast fills the gastric fundus. Patient was placed in a right-sided down position which encouraged emptying of the stomach. Contrast flowed through the 1st, 2nd, 3rd, and 4th portions of the duodenum without evidence of obstruction or leak.  IMPRESSION: No evidence of obstruction or leak following repair of perforated ulcer.   Electronically Signed   By: Genevive Bi M.D.   On: 10/20/2012  11:09    Anti-infectives: Anti-infectives   Start     Dose/Rate Route Frequency Ordered Stop   10/16/12 2100  vancomycin (VANCOCIN) 500 mg in sodium chloride 0.9 % 100 mL IVPB  Status:  Discontinued     500 mg 100 mL/hr over 60 Minutes Intravenous Every 12 hours 10/16/12 2032 10/19/12 0946   10/16/12 2000  piperacillin-tazobactam (ZOSYN) IVPB 3.375 g     3.375 g 12.5 mL/hr over 240 Minutes Intravenous Every 8 hours 10/16/12 1943     10/16/12 2000  fluconazole (DIFLUCAN) IVPB 200 mg     200 mg 100 mL/hr over 60 Minutes Intravenous Every 24 hours 10/16/12 1943     10/16/12 1030  [MAR Hold]  piperacillin-tazobactam (ZOSYN) IVPB 3.375 g     (On MAR Hold since 10/16/12 1245)   3.375 g 12.5 mL/hr over 240 Minutes Intravenous  Once 10/16/12 1028 10/16/12 1633   10/16/12 1030  vancomycin (VANCOCIN) IVPB 1000 mg/200 mL premix     1,000 mg 200 mL/hr over 60 Minutes Intravenous  Once 10/16/12 1028 10/16/12 1232      Assessment/Plan: 1. S/p repair of perforated distal gastric ulcer  2. Frequent PVCs  3. ARI-resolved 4. Abl Anemia 5. Thrombocytopenia-improved  Plan POD #5 -264ml/24 out of NGT.  Clamp, check residuals in 6 hours, DC and start on sips of clears. -229ml/24h looks serous today -hgb is slightly down, but stable.  May need iron supplement when able to take PO -mobilize -pain control -IS  , needs reinforcement -Zosyn and Fluconazole started 9/26 -continue with PPI   LOS: 5 days   Haziel Molner ANP-BC [phone]  10/21/2012 8:28 AM

## 2012-10-21 NOTE — Progress Notes (Signed)
Ileus has resolve. Upper GI - no sign of leak  D/C NG tube today - start sips of clears.  Wilmon Arms. Corliss Skains, MD, [facility]  General/ Trauma Surgery  10/21/2012 9:35 AM

## 2012-10-22 ENCOUNTER — Telehealth (INDEPENDENT_AMBULATORY_CARE_PROVIDER_SITE_OTHER): Payer: Self-pay | Admitting: General Surgery

## 2012-10-22 LAB — CBC
Hemoglobin: 8.8 g/dL — ABNORMAL LOW (ref 13.0–17.0)
MCH: 25.5 pg — ABNORMAL LOW (ref 26.0–34.0)
MCV: 75.1 fL — ABNORMAL LOW (ref 78.0–100.0)
Platelets: 141 10*3/uL — ABNORMAL LOW (ref 150–400)
RBC: 3.45 MIL/uL — ABNORMAL LOW (ref 4.22–5.81)
RDW: 16.2 % — ABNORMAL HIGH (ref 11.5–15.5)
WBC: 10.3 10*3/uL (ref 4.0–10.5)

## 2012-10-22 MED ORDER — HYDROCODONE-ACETAMINOPHEN 5-325 MG PO TABS
1.0000 | ORAL_TABLET | ORAL | Status: DC | PRN
  Administered 2012-10-22 – 2012-10-25 (×8): 2 via ORAL
  Filled 2012-10-22 (×8): qty 2

## 2012-10-22 NOTE — Progress Notes (Signed)
6 Days Post-Op  Subjective: Pt doing well, pain much improved.  No N/V, tolerated clear liquids yesterday and today.  Ambulating well through the halls.  +flatus, BM yesterday and this am.  Pt is thankful for our great care.     Objective: Vital signs in last 24 hours: Temp:  [98 F (36.7 C)-98.3 F (36.8 C)] 98 F (36.7 C) (10/02 0523) Pulse Rate:  [88-90] 88 (10/02 0523) Resp:  [17] 17 (10/02 0523) BP: (152-157)/(77-84) 152/77 mmHg (10/02 0523) SpO2:  [96 %] 96 % (10/02 0523) Last BM Date: 10/21/12  Intake/Output from previous day: 10/01 0701 - 10/02 0700 In: 3474.2 [P.O.:120; I.V.:3104.2; IV Piggyback:250] Out: 1350 [Urine:1200; Drains:150] Intake/Output this shift:    PE: Gen:  Alert, NAD, pleasant Abd: Soft, mild tenderness over open midline skin wound, clean and dry, +BS, no HSM, drain with serous fluid  Lab Results:   Recent Labs  10/21/12 0502 10/22/12 0538  WBC 8.5 10.3  HGB 8.4* 8.8*  HCT 25.2* 25.9*  PLT 133* 141*   BMET  Recent Labs  10/20/12 0640  NA 138  K 3.8  CL 105  CO2 26  GLUCOSE 113*  BUN 4*  CREATININE 1.22  CALCIUM 8.1*   PT/INR No results found for this basename: LABPROT, INR,  in the last 72 hours CMP     Component Value Date/Time   NA 138 10/20/2012 0640   K 3.8 10/20/2012 0640   CL 105 10/20/2012 0640   CO2 26 10/20/2012 0640   GLUCOSE 113* 10/20/2012 0640   BUN 4* 10/20/2012 0640   CREATININE 1.22 10/20/2012 0640   CALCIUM 8.1* 10/20/2012 0640   PROT 7.8 10/16/2012 0730   ALBUMIN 4.0 10/16/2012 0730   AST 27 10/16/2012 0730   ALT 35 10/16/2012 0730   ALKPHOS 76 10/16/2012 0730   BILITOT 1.3* 10/16/2012 0730   GFRNONAA 63* 10/20/2012 0640   GFRAA 73* 10/20/2012 0640   Lipase     Component Value Date/Time   LIPASE 24 10/16/2012 0730       Studies/Results: Dg Ugi W/water Sol Cm  10/20/2012   CLINICAL DATA:  Patient status post repair of perforated ulcer repair.  EXAM: ESOPHOGRAM/BARIUM SWALLOW  TECHNIQUE: Single contrast  examination was performed using water-soluble contrast.  COMPARISON:  CT exam 10/16/2012  FLUOROSCOPY TIME:  1 min 45 seconds  FINDINGS: Initial scout images demonstrates a NG tube within the stomach. Surgical drain in the mid abdomen from a right-sided approach. Hand injection of 100 cc of water-soluble contrast fills the gastric fundus. Patient was placed in a right-sided down position which encouraged emptying of the stomach. Contrast flowed through the 1st, 2nd, 3rd, and 4th portions of the duodenum without evidence of obstruction or leak.  IMPRESSION: No evidence of obstruction or leak following repair of perforated ulcer.   Electronically Signed   By: Genevive Bi M.D.   On: 10/20/2012 11:09    Anti-infectives: Anti-infectives   Start     Dose/Rate Route Frequency Ordered Stop   10/16/12 2100  vancomycin (VANCOCIN) 500 mg in sodium chloride 0.9 % 100 mL IVPB  Status:  Discontinued     500 mg 100 mL/hr over 60 Minutes Intravenous Every 12 hours 10/16/12 2032 10/19/12 0946   10/16/12 2000  piperacillin-tazobactam (ZOSYN) IVPB 3.375 g     3.375 g 12.5 mL/hr over 240 Minutes Intravenous Every 8 hours 10/16/12 1943     10/16/12 2000  fluconazole (DIFLUCAN) IVPB 200 mg     200  mg 100 mL/hr over 60 Minutes Intravenous Every 24 hours 10/16/12 1943     10/16/12 1030  [MAR Hold]  piperacillin-tazobactam (ZOSYN) IVPB 3.375 g     (On MAR Hold since 10/16/12 1245)   3.375 g 12.5 mL/hr over 240 Minutes Intravenous  Once 10/16/12 1028 10/16/12 1633   10/16/12 1030  vancomycin (VANCOCIN) IVPB 1000 mg/200 mL premix     1,000 mg 200 mL/hr over 60 Minutes Intravenous  Once 10/16/12 1028 10/16/12 1232       Assessment/Plan 1. POD #6 S/p repair of perforated distal gastric ulcer  2. Frequent PVCs  3. ARI-resolved  4. Abl Anemia  5. Thrombocytopenia-improved   Plan: -NG clamped yesterday and started on clears, advance to fulls -162ml/24h -hgb improved today to 8.8 -mobilize  -pain control   -IS , needs reinforcement  -Zosyn and Fluconazole started 9/26  -continue with PPI -Home tomorrow or Saturday if doing well    LOS: 6 days    DORT, Hazleigh Mccleave 10/22/2012, 9:23 AM Pager: [phone]

## 2012-10-22 NOTE — Telephone Encounter (Signed)
LMOM @ 5:13 for pt to call back to set up follow up appt in the next 2-3 weeks per Dort PA s/p exp lap and perf ulcer

## 2012-10-22 NOTE — Progress Notes (Signed)
Doing well. Advance diet Home soon.  Wilmon Arms. Corliss Skains, MD, [facility] Surgery  General/ Trauma Surgery  10/22/2012 11:06 AM

## 2012-10-23 ENCOUNTER — Encounter (HOSPITAL_COMMUNITY): Payer: Self-pay | Admitting: General Surgery

## 2012-10-23 ENCOUNTER — Inpatient Hospital Stay (HOSPITAL_COMMUNITY): Payer: Medicaid Other

## 2012-10-23 DIAGNOSIS — J90 Pleural effusion, not elsewhere classified: Secondary | ICD-10-CM

## 2012-10-23 DIAGNOSIS — K668 Other specified disorders of peritoneum: Secondary | ICD-10-CM

## 2012-10-23 LAB — H.PYLORI ANTIGEN, STOOL: H. pylori ag, stool: POSITIVE

## 2012-10-23 LAB — CBC
HCT: 30.3 % — ABNORMAL LOW (ref 39.0–52.0)
Hemoglobin: 10 g/dL — ABNORMAL LOW (ref 13.0–17.0)
MCH: 24.6 pg — ABNORMAL LOW (ref 26.0–34.0)
MCHC: 33 g/dL (ref 30.0–36.0)
MCV: 74.6 fL — ABNORMAL LOW (ref 78.0–100.0)
Platelets: 193 10*3/uL (ref 150–400)
RBC: 4.06 MIL/uL — ABNORMAL LOW (ref 4.22–5.81)
RDW: 16.8 % — ABNORMAL HIGH (ref 11.5–15.5)
WBC: 12.6 10*3/uL — ABNORMAL HIGH (ref 4.0–10.5)

## 2012-10-23 LAB — PRO B NATRIURETIC PEPTIDE: Pro B Natriuretic peptide (BNP): 1858 pg/mL — ABNORMAL HIGH (ref 0–125)

## 2012-10-23 LAB — BASIC METABOLIC PANEL
CO2: 28 mEq/L (ref 19–32)
Calcium: 8.8 mg/dL (ref 8.4–10.5)
Chloride: 100 mEq/L (ref 96–112)
GFR calc non Af Amer: 72 mL/min — ABNORMAL LOW (ref >90)
Glucose, Bld: 109 mg/dL — ABNORMAL HIGH (ref 70–99)
Potassium: 3.6 mEq/L (ref 3.5–5.1)
Sodium: 137 mEq/L (ref 135–145)

## 2012-10-23 MED ORDER — FUROSEMIDE 10 MG/ML IJ SOLN
40.0000 mg | Freq: Once | INTRAMUSCULAR | Status: AC
  Administered 2012-10-23: 40 mg via INTRAVENOUS
  Filled 2012-10-23: qty 4

## 2012-10-23 MED ORDER — PANTOPRAZOLE SODIUM 40 MG PO TBEC: 40.0000 mg | DELAYED_RELEASE_TABLET | Freq: Every day | ORAL | Status: DC

## 2012-10-23 MED ORDER — HYDROCODONE-ACETAMINOPHEN 5-325 MG PO TABS: 1.0000 | ORAL_TABLET | ORAL | Status: DC | PRN

## 2012-10-23 NOTE — Progress Notes (Addendum)
Called again by PA about possible CHF.  Patient had no clinical symptoms of CHF but Xray showed effusion.  PA mentions lack of effort on incentive spirometry.   Prior ECHO showed normal LV function with EF 55-60%  Suggested to get BNP, if elevated significantly could be volume overload that is worse with surgery and fluids.  He is 10 liters positive ( no good outputs recently) now and looked like he had some transient renal failure around the time of surgery that is getting better.   The effusion may just be post op related to recent gastic surgey and too much volume. In addition, the BUN is down suggesting too much volume   Rec  1. BNP  Lasix 40 mg IV  X 1 to help with volume  2. Should have a followup ECHO in 6 months 3. Arrange for internal medicine followup 4. I will have team f/u in am      W. Ashley Royalty MD [facility]

## 2012-10-23 NOTE — Progress Notes (Signed)
MATCH letter given to patient's daughter in law Hdong cell [phone] home [phone] , and explained . Hdong who stated they can afford $3.  Home heath will be provided by [facility] [phone]   Ronny Flurry RN BSN [phone]

## 2012-10-23 NOTE — Telephone Encounter (Signed)
LMOM @9 :30 for pt to call back to set up follow up appt..see below

## 2012-10-23 NOTE — Telephone Encounter (Signed)
Patient is still inpatient.  Appt setup for 10/23 @930a  and sent to [facility] PA to put on patient's discharge paperwork and update patient.

## 2012-10-23 NOTE — Progress Notes (Signed)
He looks well with no real source, effusions may just be postop and not any chf, will follow wbc tomorrow before investigating any further

## 2012-10-23 NOTE — Progress Notes (Signed)
7 Days Post-Op  Subjective: Patient feels good.  Pain well controlled, but pain medication does wear off and the pain returns.  No N/V tolerating D3 diet, no N/V.  Having BM's and flatus.  No urinary symptoms, no chest pain or SOB, but pain with deep breathing in abdomen.  IS is about 1250.  Ambulating well through the halls.  Objective: Vital signs in last 24 hours: Temp:  [98 F (36.7 C)-99.1 F (37.3 C)] 98.2 F (36.8 C) (10/03 0551) Pulse Rate:  [78-84] 78 (10/03 0551) Resp:  [16-18] 16 (10/03 0551) BP: (155-157)/(68-82) 157/68 mmHg (10/03 0551) SpO2:  [96 %-99 %] 96 % (10/03 0551) Last BM Date: 10/22/12  Intake/Output from previous day: 10/02 0701 - 10/03 0700 In: 2407.9 [P.O.:260; I.V.:1897.9; IV Piggyback:250] Out: 75 [Drains:75] Intake/Output this shift:    PE: Gen:  Alert, NAD, pleasant Abd: Soft, mild tenderness over open midline skin wound, clean and dry, +BS, no HSM, drain with serous fluid    Lab Results:   Recent Labs  10/22/12 0538 10/23/12 0617  WBC 10.3 12.6*  HGB 8.8* 10.0*  HCT 25.9* 30.3*  PLT 141* 193   BMET  Recent Labs  10/23/12 0617  NA 137  K 3.6  CL 100  CO2 28  GLUCOSE 109*  BUN 4*  CREATININE 1.09  CALCIUM 8.8   PT/INR No results found for this basename: LABPROT, INR,  in the last 72 hours CMP     Component Value Date/Time   NA 137 10/23/2012 0617   K 3.6 10/23/2012 0617   CL 100 10/23/2012 0617   CO2 28 10/23/2012 0617   GLUCOSE 109* 10/23/2012 0617   BUN 4* 10/23/2012 0617   CREATININE 1.09 10/23/2012 0617   CALCIUM 8.8 10/23/2012 0617   PROT 7.8 10/16/2012 0730   ALBUMIN 4.0 10/16/2012 0730   AST 27 10/16/2012 0730   ALT 35 10/16/2012 0730   ALKPHOS 76 10/16/2012 0730   BILITOT 1.3* 10/16/2012 0730   GFRNONAA 72* 10/23/2012 0617   GFRAA 84* 10/23/2012 0617   Lipase     Component Value Date/Time   LIPASE 24 10/16/2012 0730       Studies/Results: No results found.  Anti-infectives: Anti-infectives   Start      Dose/Rate Route Frequency Ordered Stop   10/16/12 2100  vancomycin (VANCOCIN) 500 mg in sodium chloride 0.9 % 100 mL IVPB  Status:  Discontinued     500 mg 100 mL/hr over 60 Minutes Intravenous Every 12 hours 10/16/12 2032 10/19/12 0946   10/16/12 2000  piperacillin-tazobactam (ZOSYN) IVPB 3.375 g     3.375 g 12.5 mL/hr over 240 Minutes Intravenous Every 8 hours 10/16/12 1943     10/16/12 2000  fluconazole (DIFLUCAN) IVPB 200 mg     200 mg 100 mL/hr over 60 Minutes Intravenous Every 24 hours 10/16/12 1943     10/16/12 1030  [MAR Hold]  piperacillin-tazobactam (ZOSYN) IVPB 3.375 g     (On MAR Hold since 10/16/12 1245)   3.375 g 12.5 mL/hr over 240 Minutes Intravenous  Once 10/16/12 1028 10/16/12 1633   10/16/12 1030  vancomycin (VANCOCIN) IVPB 1000 mg/200 mL premix     1,000 mg 200 mL/hr over 60 Minutes Intravenous  Once 10/16/12 1028 10/16/12 1232       Assessment/Plan 1. POD #7 S/p repair of perforated distal gastric ulcer  2. Frequent PVCs  3. ARI-resolved  4. Abl Anemia  5. Thrombocytopenia-improved  6.  Leukocytosis - new today to 12.6  Plan:  -NG clamped yesterday and started on clears, advance to fulls  -47ml/24h  -hgb improved today to 10.0 -mobilize  -pain control  -IS , needs reinforcement  -Zosyn and Fluconazole started 9/26  -continue with PPI  -Home today or Saturday if doing well, but concerned about jump in WBC  Addendum 10/23/12 1:39pm: 1.  Saw CXR results with bilateral airspace disease and bilateral small effusion most consistent with CHF.  Called cards for a consult for new finding.     LOS: 7 days   Aris [place] 10/23/2012, 7:46 AM Pager: [phone]

## 2012-10-24 ENCOUNTER — Encounter (HOSPITAL_COMMUNITY): Payer: Self-pay | Admitting: Cardiology

## 2012-10-24 DIAGNOSIS — I509 Heart failure, unspecified: Secondary | ICD-10-CM

## 2012-10-24 DIAGNOSIS — I5031 Acute diastolic (congestive) heart failure: Secondary | ICD-10-CM

## 2012-10-24 HISTORY — DX: Acute diastolic (congestive) heart failure: I50.31

## 2012-10-24 LAB — CBC
MCV: 74 fL — ABNORMAL LOW (ref 78.0–100.0)
Platelets: 218 10*3/uL (ref 150–400)
RBC: 3.93 MIL/uL — ABNORMAL LOW (ref 4.22–5.81)
RDW: 16.3 % — ABNORMAL HIGH (ref 11.5–15.5)

## 2012-10-24 LAB — BASIC METABOLIC PANEL
BUN: 11 mg/dL (ref 6–23)
Calcium: 8.5 mg/dL (ref 8.4–10.5)
Chloride: 104 mEq/L (ref 96–112)
Creatinine, Ser: 1.34 mg/dL (ref 0.50–1.35)
GFR calc Af Amer: 65 mL/min — ABNORMAL LOW (ref >90)
Potassium: 2.8 mEq/L — ABNORMAL LOW (ref 3.5–5.1)

## 2012-10-24 MED ORDER — POLYETHYLENE GLYCOL 3350 17 G PO PACK
17.0000 g | PACK | Freq: Once | ORAL | Status: AC
  Administered 2012-10-24: 17 g via ORAL
  Filled 2012-10-24: qty 1

## 2012-10-24 MED ORDER — POTASSIUM CHLORIDE CRYS ER 20 MEQ PO TBCR
40.0000 meq | EXTENDED_RELEASE_TABLET | Freq: Two times a day (BID) | ORAL | Status: AC
  Administered 2012-10-24 (×2): 40 meq via ORAL
  Filled 2012-10-24 (×3): qty 2

## 2012-10-24 MED ORDER — PANTOPRAZOLE SODIUM 40 MG PO TBEC
40.0000 mg | DELAYED_RELEASE_TABLET | Freq: Two times a day (BID) | ORAL | Status: DC
  Administered 2012-10-24 – 2012-10-25 (×2): 40 mg via ORAL
  Filled 2012-10-24 (×2): qty 1

## 2012-10-24 NOTE — Progress Notes (Signed)
8 Days Post-Op  Subjective: Doing well, several bms eating regular food  Objective: Vital signs in last 24 hours: Temp:  [97.7 F (36.5 C)-98.1 F (36.7 C)] 97.7 F (36.5 C) (10/04 0503) Pulse Rate:  [70-81] 81 (10/04 0503) Resp:  [18-19] 18 (10/04 0503) BP: (134-152)/(68-74) 134/68 mmHg (10/04 0503) SpO2:  [98 %-100 %] 100 % (10/04 0503) Last BM Date: 10/23/12  Intake/Output from previous day: 10/03 0701 - 10/04 0700 In: 1180 [P.O.:80; I.V.:850; IV Piggyback:250] Out: 65 [Drains:65] Intake/Output this shift:    General appearance: no distress Resp: diminished breath sounds bibasilar Cardio: regular rate and rhythm GI: wound clean without infection, drain with serous fluid, soft approp tender  Lab Results:   Recent Labs  10/23/12 0617 10/24/12 0540  WBC 12.6* 7.5  HGB 10.0* 9.6*  HCT 30.3* 29.1*  PLT 193 218   BMET  Recent Labs  10/23/12 0617 10/24/12 0540  NA 137 140  K 3.6 2.8*  CL 100 104  CO2 28 29  GLUCOSE 109* 83  BUN 4* 11  CREATININE 1.09 1.34  CALCIUM 8.8 8.5   PT/INR No results found for this basename: LABPROT, INR,  in the last 72 hours ABG No results found for this basename: PHART, PCO2, PO2, HCO3,  in the last 72 hours  Studies/Results: Dg Chest 1 View  10/23/2012   CLINICAL DATA:  Increased white blood count.  EXAM: CHEST - 1 VIEW  COMPARISON:  10/16/2012  FINDINGS: Mild cardiac enlargement. Interval development of patchy bilateral airspace disease and small bilateral pleural effusions. Findings are most suggestive of heart failure with edema. Pneumonia not excluded  IMPRESSION: Interval development of bilateral airspace disease and small effusions most consistent with congestive heart failure.   Electronically Signed   By: Marlan Palau M.D.   On: 10/23/2012 09:51    Anti-infectives: Anti-infectives   Start     Dose/Rate Route Frequency Ordered Stop   10/16/12 2100  vancomycin (VANCOCIN) 500 mg in sodium chloride 0.9 % 100 mL IVPB   Status:  Discontinued     500 mg 100 mL/hr over 60 Minutes Intravenous Every 12 hours 10/16/12 2032 10/19/12 0946   10/16/12 2000  piperacillin-tazobactam (ZOSYN) IVPB 3.375 g     3.375 g 12.5 mL/hr over 240 Minutes Intravenous Every 8 hours 10/16/12 1943     10/16/12 2000  fluconazole (DIFLUCAN) IVPB 200 mg     200 mg 100 mL/hr over 60 Minutes Intravenous Every 24 hours 10/16/12 1943     10/16/12 1030  [MAR Hold]  piperacillin-tazobactam (ZOSYN) IVPB 3.375 g     (On MAR Hold since 10/16/12 1245)   3.375 g 12.5 mL/hr over 240 Minutes Intravenous  Once 10/16/12 1028 10/16/12 1633   10/16/12 1030  vancomycin (VANCOCIN) IVPB 1000 mg/200 mL premix     1,000 mg 200 mL/hr over 60 Minutes Intravenous  Once 10/16/12 1028 10/16/12 1232      Assessment/Plan: POD 8 graham patch duodenal ulcer 1. Po pain meds 2. Will cont abx 24 hours then stop, wbc nl today, no fevers 3. Cards consult pending, got lasix yesterday, bnp elevated, will need outpt follow up also 4. Hypokalemia- will replace today and recheck in am prior to discharge 5. Sq heparin 6. Dc in am, will remove drain prior to dc and stop abx  [facility] 10/24/2012

## 2012-10-24 NOTE — Progress Notes (Signed)
SUBJECTIVE:  No complaints ambulating in hall  OBJECTIVE:   Vitals:   Filed Vitals:   10/23/12 0551 10/23/12 1410 10/23/12 2224 10/24/12 0503  BP: 157/68 152/74 136/74 134/68  Pulse: 78 72 70 81  Temp: 98.2 F (36.8 C) 98.1 F (36.7 C) 98.1 F (36.7 C) 97.7 F (36.5 C)  TempSrc: Oral Oral Oral Oral  Resp: 16 19 18 18   Height:      Weight:      SpO2: 96% 99% 98% 100%   I&O's:   Intake/Output Summary (Last 24 hours) at 10/24/12 1014 Last data filed at 10/24/12 0900  Gross per 24 hour  Intake   1420 ml  Output     65 ml  Net   1355 ml   TELEMETRY: Reviewed telemetry pt in NSR:     PHYSICAL EXAM General: Well developed, well nourished, in no acute distress Head: Eyes PERRLA, No xanthomas.   Normal cephalic and atramatic  Lungs:   Clear bilaterally to auscultation and percussion. Heart:   HRRR S1 S2 Pulses are 2+ & equal. Abdomen: Bowel sounds are positive, abdomen soft and non-tender without masses Neuro: Alert and oriented X 3. Psych:  Good affect, responds appropriately   LABS: Basic Metabolic Panel:  Recent Labs  96/04/54 0617 10/24/12 0540  NA 137 140  K 3.6 2.8*  CL 100 104  CO2 28 29  GLUCOSE 109* 83  BUN 4* 11  CREATININE 1.09 1.34  CALCIUM 8.8 8.5   Liver Function Tests: No results found for this basename: AST, ALT, ALKPHOS, BILITOT, PROT, ALBUMIN,  in the last 72 hours No results found for this basename: LIPASE, AMYLASE,  in the last 72 hours CBC:  Recent Labs  10/23/12 0617 10/24/12 0540  WBC 12.6* 7.5  HGB 10.0* 9.6*  HCT 30.3* 29.1*  MCV 74.6* 74.0*  PLT 193 218    No results found for this basename: INR, PROTIME    RADIOLOGY: Dg Chest 1 View  10/23/2012   CLINICAL DATA:  Increased white blood count.  EXAM: CHEST - 1 VIEW  COMPARISON:  10/16/2012  FINDINGS: Mild cardiac enlargement. Interval development of patchy bilateral airspace disease and small bilateral pleural effusions. Findings are most suggestive of heart failure with  edema. Pneumonia not excluded  IMPRESSION: Interval development of bilateral airspace disease and small effusions most consistent with congestive heart failure.   Electronically Signed   By: Marlan Palau M.D.   On: 10/23/2012 09:51   Ct Abdomen Pelvis W Contrast  10/16/2012   CLINICAL DATA:  Diffuse nonspecific abdominal pain.  EXAM: CT ABDOMEN AND PELVIS WITH CONTRAST  TECHNIQUE: Multidetector CT imaging of the abdomen and pelvis was performed using the standard protocol following bolus administration of intravenous contrast.  CONTRAST:  80mL OMNIPAQUE IOHEXOL 300 MG/ML  SOLN  COMPARISON:  None available.  FINDINGS: Bilateral lower lobe airspace disease is evident with diffuse interstitial coarsening. No significant consolidation is identified. The heart size is normal. No significant pleural or pericardial effusion is evident.  The proximal stomach is dilated with a fluid level of contrast. There is a relative obstruction with inflammatory changes involving the 2nd portion of the duodenum. The more distal duodenum and the pancreas are within normal limits.  The liver and spleen are normal. Common bile duct and gallbladder are unremarkable. The adrenal glands are within normal limits.  Pneumoperitoneum there is evident with gas along the anterior peritoneal wall and within Morrison's pouch. A 9 mm cyst is present at  the upper pole of the left kidney. The kidneys and ureters are otherwise within normal limits.  The rectosigmoid colon is within normal limits. There is extensive fluid surrounding the cecum and in the right pericolic gutter. Inflammatory changes are present at the hepatic flexure.  Stranding is present within the small bowel mesenteric. No focal small bowel dilation or mass is evident. Atherosclerotic calcifications are present in the aorta and branch vessels without aneurysm.  Bone windows demonstrate no focal lytic or blastic lesions.  IMPRESSION: 1. Pneumoperitoneum suggesting bowel  perforation. 2. Inflammatory changes about the hepatic flexure of the colon and 2nd portion of the duodenum. This likely represents a perforated diverticulum of the duodenum which appears more inflamed than the colon. 3. Relative outlet obstruction of the stomach is likely secondary to the duodenum inflammatory changes. 4. Interstitial coarsening and airspace disease at the lung bases bilaterally likely represents edema. No significant airspace consolidation is evident. CriticalValue/emergent results were called by telephone at the time of interpretation on 10/16/2012 at 10:25 AMto Dr. Margarita Grizzle , who verbally acknowledged these results.   Electronically Signed   By: Gennette Pac   On: 10/16/2012 10:28   Dg Chest Port 1 View  10/16/2012   CLINICAL DATA:  Chest and abdominal pain.  EXAM: PORTABLE CHEST - 1 VIEW  COMPARISON:  None.  FINDINGS: The patient is rotated to the right on today's radiograph, reducing diagnostic sensitivity and specificity. Low lung volumes are present, causing crowding of the pulmonary vasculature. Borderline cardiomegaly, which may be exaggerated by the low lung volumes. Linear opacity/interstitial accentuation at the lung bases. No blunting of the costophrenic angles observed.  IMPRESSION: 1. Low lung volumes with linear opacities at the lung bases favoring subsegmental atelectasis over a basilar interstitial edema process.   Electronically Signed   By: Herbie Baltimore   On: 10/16/2012 07:56   Dg Kayleen Memos W/water Sol Cm  10/20/2012   CLINICAL DATA:  Patient status post repair of perforated ulcer repair.  EXAM: ESOPHOGRAM/BARIUM SWALLOW  TECHNIQUE: Single contrast examination was performed using water-soluble contrast.  COMPARISON:  CT exam 10/16/2012  FLUOROSCOPY TIME:  1 min 45 seconds  FINDINGS: Initial scout images demonstrates a NG tube within the stomach. Surgical drain in the mid abdomen from a right-sided approach. Hand injection of 100 cc of water-soluble contrast fills the  gastric fundus. Patient was placed in a right-sided down position which encouraged emptying of the stomach. Contrast flowed through the 1st, 2nd, 3rd, and 4th portions of the duodenum without evidence of obstruction or leak.  IMPRESSION: No evidence of obstruction or leak following repair of perforated ulcer.   Electronically Signed   By: Genevive Bi M.D.   On: 10/20/2012 11:09      ASSESSMENT:  1.  Acute diastolic CHF most likely secondary to IVF during surgery- received IV Lasix yesterday. CHF now resolved.  Ambulating in the hall without SOB and lungs are clear with no LE edema.  Will not give any further lasix 2.  Renal insufficiency 3.  Duodenal ulcer s/p patch repair POD#8 4.  Hypokalemia  PLAN:   1.  Replete potassium 2.  Follow up with Dr. Donnie Aho as outpt  Quintella Reichert, MD  10/24/2012  10:14 AM

## 2012-10-24 NOTE — Progress Notes (Signed)
Nutrition Brief Note  Malnutrition Screening Tool result is inaccurate.  Please consult if nutrition needs are identified.  Kaynan Klonowski RD, LDN Pager #[phone] After Hours pager #[phone]   

## 2012-10-25 DIAGNOSIS — R69 Illness, unspecified: Secondary | ICD-10-CM

## 2012-10-25 LAB — PRO B NATRIURETIC PEPTIDE: Pro B Natriuretic peptide (BNP): 274.5 pg/mL — ABNORMAL HIGH (ref 0–125)

## 2012-10-25 LAB — BASIC METABOLIC PANEL
CO2: 28 mEq/L (ref 19–32)
Calcium: 9 mg/dL (ref 8.4–10.5)
Chloride: 102 mEq/L (ref 96–112)
GFR calc Af Amer: 58 mL/min — ABNORMAL LOW (ref >90)
Sodium: 138 mEq/L (ref 135–145)

## 2012-10-25 MED ORDER — INFLUENZA VAC SPLIT QUAD 0.5 ML IM SUSP
0.5000 mL | INTRAMUSCULAR | Status: AC | PRN
  Administered 2012-10-25: 0.5 mL via INTRAMUSCULAR

## 2012-10-25 MED ORDER — OXYCODONE-ACETAMINOPHEN 5-325 MG PO TABS: 1.0000 | ORAL_TABLET | ORAL | Status: DC | PRN

## 2012-10-25 MED FILL — Influenza Virus Vaccine Split Quadrivalent Inj 0.5 ML: INTRAMUSCULAR | Qty: 0.5 | Status: AC

## 2012-10-25 NOTE — Progress Notes (Signed)
10/25/2012 1400 Notifed [facility] of dc home today with [facility] RN with dressing changes. Isidoro Donning RN CCM Case Mgmt phone [phone]

## 2012-10-25 NOTE — Progress Notes (Signed)
Discussed discharge instructions with patient and son, Drew Walters, as an interpretor. Home medications gone over. Follow up appointment with surgeon is made. Reviewed signs and symptoms of infection. Gave prescriptions to patient and already has match letter.  Dc'd IV, changed patient's dressing. Patient appeared to be in no acute distress. [facility]. Patient and family has no further questions at this time.

## 2012-10-25 NOTE — Progress Notes (Signed)
9 Days Post-Op  Subjective: Looks well tolerating diet no SOB Objective: Vital signs in last 24 hours: Temp:  [98 F (36.7 C)-98.6 F (37 C)] 98 F (36.7 C) (10/05 0554) Pulse Rate:  [65-73] 65 (10/05 0554) Resp:  [18] 18 (10/05 0554) BP: (139-148)/(71-77) 148/72 mmHg (10/05 0554) SpO2:  [95 %-100 %] 96 % (10/05 0554) Last BM Date: 10/23/12  Intake/Output from previous day: 10/04 0701 - 10/05 0700 In: 1010 [P.O.:960; IV Piggyback:50] Out: 15 [Drains:15] Intake/Output this shift: Total I/O In: 240 [P.O.:240] Out: -   Resp: clear to auscultation bilaterally Cardio: regular rate and rhythm, S1, S2 normal, no murmur, click, rub or gallop Incision/Wound:OPEN AND CLEAN  DRAIN REMOVED SOFT  Lab Results:   Recent Labs  10/23/12 0617 10/24/12 0540  WBC 12.6* 7.5  HGB 10.0* 9.6*  HCT 30.3* 29.1*  PLT 193 218   BMET  Recent Labs  10/24/12 0540 10/25/12 0520  NA 140 138  K 2.8* 4.3  CL 104 102  CO2 29 28  GLUCOSE 83 79  BUN 11 20  CREATININE 1.34 1.48*  CALCIUM 8.5 9.0   PT/INR No results found for this basename: LABPROT, INR,  in the last 72 hours ABG No results found for this basename: PHART, PCO2, PO2, HCO3,  in the last 72 hours  Studies/Results: No results found.  Anti-infectives: Anti-infectives   Start     Dose/Rate Route Frequency Ordered Stop   10/16/12 2100  vancomycin (VANCOCIN) 500 mg in sodium chloride 0.9 % 100 mL IVPB  Status:  Discontinued     500 mg 100 mL/hr over 60 Minutes Intravenous Every 12 hours 10/16/12 2032 10/19/12 0946   10/16/12 2000  piperacillin-tazobactam (ZOSYN) IVPB 3.375 g     3.375 g 12.5 mL/hr over 240 Minutes Intravenous Every 8 hours 10/16/12 1943     10/16/12 2000  fluconazole (DIFLUCAN) IVPB 200 mg     200 mg 100 mL/hr over 60 Minutes Intravenous Every 24 hours 10/16/12 1943     10/16/12 1030  [MAR Hold]  piperacillin-tazobactam (ZOSYN) IVPB 3.375 g     (On MAR Hold since 10/16/12 1245)   3.375 g 12.5 mL/hr over  240 Minutes Intravenous  Once 10/16/12 1028 10/16/12 1633   10/16/12 1030  vancomycin (VANCOCIN) IVPB 1000 mg/200 mL premix     1,000 mg 200 mL/hr over 60 Minutes Intravenous  Once 10/16/12 1028 10/16/12 1232      Assessment/Plan: s/p Procedure(s): EXPLORATORY LAPAROTOMY (N/A) REPAIR OF PERFORATED GASTRIC ULCER ,GRAHAM PATCH (N/A) Discharge  LOS: 9 days    Drew Walters A. 10/25/2012

## 2012-10-25 NOTE — Discharge Summary (Signed)
Physician Discharge Summary  Patient ID: Drew Walters MRN: [id number] DOB/AGE: 09/20/1955 57 y.o.  Admit date: 10/16/2012 Discharge date: 10/25/2012  Admission Diagnoses:peritonitis  Discharge Diagnoses:  Principal Problem:   Gastric ulcer with perforation Active Problems:   Pneumoperitoneum   Hyperkalemia   Leukocytosis   H/O gastritis   PVCs (premature ventricular contractions)   Acute diastolic CHF (congestive heart failure)   Discharged Condition: good  Hospital Course: pt did well.  Diet slowly advanced but found to have elevated BNP cositent with CHF.  Diuresis lowered BNP and SOB improved.  Drain removed prior to discharge. HHN in place for wound care.  Questions answered.  Bowels moving and tolerating diet with good pain control.   Consults: cardiology  Significant Diagnostic Studies: labs:  BNP    Component Value Date/Time   PROBNP 274.5* 10/25/2012 0520   CBC    Component Value Date/Time   WBC 7.5 10/24/2012 0540   RBC 3.93* 10/24/2012 0540   HGB 9.6* 10/24/2012 0540   HCT 29.1* 10/24/2012 0540   PLT 218 10/24/2012 0540   MCV 74.0* 10/24/2012 0540   MCH 24.4* 10/24/2012 0540   MCHC 33.0 10/24/2012 0540   RDW 16.3* 10/24/2012 0540   LYMPHSABS 0.6* 10/16/2012 0730   MONOABS 1.8* 10/16/2012 0730   EOSABS 0.0 10/16/2012 0730   BASOSABS 0.0 10/16/2012 0730   CMP     Component Value Date/Time   NA 138 10/25/2012 0520   K 4.3 10/25/2012 0520   CL 102 10/25/2012 0520   CO2 28 10/25/2012 0520   GLUCOSE 79 10/25/2012 0520   BUN 20 10/25/2012 0520   CREATININE 1.48* 10/25/2012 0520   CALCIUM 9.0 10/25/2012 0520   PROT 7.8 10/16/2012 0730   ALBUMIN 4.0 10/16/2012 0730   AST 27 10/16/2012 0730   ALT 35 10/16/2012 0730   ALKPHOS 76 10/16/2012 0730   BILITOT 1.3* 10/16/2012 0730   GFRNONAA 50* 10/25/2012 0520   GFRAA 58* 10/25/2012 0520     Treatments: surgery: closure of ulcer gastric  Discharge Exam: Blood pressure 148/72, pulse 65, temperature 98 F (36.7 C), temperature  source Oral, resp. rate 18, height 5\' 2"  (1.575 m), weight 124 lb 9 oz (56.5 kg), SpO2 96.00%. General appearance: alert and cooperative Resp: clear to auscultation bilaterally Cardio: regular rate and rhythm, S1, S2 normal, no murmur, click, rub or gallop Incision/Wound:clean open soft ND NT  Disposition: Final discharge disposition not confirmed  Discharge Orders   Future Appointments Provider Department Dept Phone   11/12/2012 9:30 AM Atilano Ina, MD [facility], [place] [phone]   Future Orders Complete By Expires   Diet - low sodium heart healthy  As directed    Discharge instructions  As directed    Comments:     [facility] TO ASSIST WITH WOUND CARE STARTING Monday.  CALL [phone] FOR FOLLOW UP APPT ON Monday FOR 2 WEEKS.   Driving Restrictions  As directed    Comments:     NO DRIVING FOR 3 WEEKS   Increase activity slowly  As directed    Lifting restrictions  As directed    Comments:     NO LIFTING FOR 4 WEEKS       Medication List         bismuth subsalicylate 262 MG/15ML suspension  Commonly known as:  PEPTO BISMOL  Take 5 mLs by mouth every 6 (six) hours as needed for indigestion.     oxyCODONE-acetaminophen 5-325 MG per tablet  Commonly known as:  ROXICET  Take 1 tablet by mouth every 4 (four) hours as needed for pain.     pantoprazole 40 MG tablet  Commonly known as:  PROTONIX  Take 1 tablet (40 mg total) by mouth daily.           Follow-up Information   Follow up with [facility]    . (Call phone number [phone] - Please call as soon as possible to establish primary care and post hospital follow up)    Contact information:   [address] [place] [address]       Follow up with Donnie Aho JR,W SPENCER, MD. Schedule an appointment as soon as possible for a visit in 6 months. (Will need a repeat echocardiogram in 6months, please call the office to set up a time)    Specialty:  Cardiology    Contact information:   [address] [place] [address] [phone]       Follow up with Atilano Ina, MD On 11/12/2012. (YOUR APPOINTMENT IS AT 9:30AM, PLEASE ARRIVE BY 9:00AM FOR CHECK IN AND TO FILL OUT PAPERWORK)    Specialty:  General Surgery   Contact information:   [address] [place] [address] [phone]       Signed: Harriette Bouillon A. 10/25/2012, 10:15 AM

## 2012-11-05 ENCOUNTER — Telehealth (INDEPENDENT_AMBULATORY_CARE_PROVIDER_SITE_OTHER): Payer: Self-pay | Admitting: *Deleted

## 2012-11-05 NOTE — Telephone Encounter (Signed)
Ok to shower

## 2012-11-05 NOTE — Telephone Encounter (Signed)
Left voicemail for Drew Walters to update her on below message.  Instructed her to call back if she had any further questions.

## 2012-11-05 NOTE — Telephone Encounter (Signed)
Katie with [facility] called to ask about patient taking showers. Patient had exploratory lap on 10/16/12.  Patient has no drains so I wasn't sure why he hasn't been taking them and didn't know if there was something special about this patient.  She reports patient's abdominal incision is closed and they have changed from wet to dry dressings to just dry dressings.  I explained that I would check with Dr. Andrey Campanile to just make sure but I think it should be fine for patient to shower.  Explained I would give her a call back as soon as I knew from Dr. Andrey Campanile.

## 2012-11-12 ENCOUNTER — Ambulatory Visit (INDEPENDENT_AMBULATORY_CARE_PROVIDER_SITE_OTHER): Payer: Self-pay | Admitting: General Surgery

## 2012-11-12 ENCOUNTER — Encounter (INDEPENDENT_AMBULATORY_CARE_PROVIDER_SITE_OTHER): Payer: Self-pay | Admitting: General Surgery

## 2012-11-12 VITALS — BP 116/66 | HR 68 | Temp 97.7°F | Resp 14 | Ht 63.0 in | Wt 113.4 lb

## 2012-11-12 DIAGNOSIS — Z09 Encounter for follow-up examination after completed treatment for conditions other than malignant neoplasm: Secondary | ICD-10-CM

## 2012-11-12 MED ORDER — OXYCODONE-ACETAMINOPHEN 5-325 MG PO TABS: 1.0000 | ORAL_TABLET | ORAL | Status: AC | PRN

## 2012-11-12 MED ORDER — PANTOPRAZOLE SODIUM 40 MG PO TBEC: 40.0000 mg | DELAYED_RELEASE_TABLET | Freq: Every day | ORAL | Status: AC

## 2012-11-12 NOTE — Patient Instructions (Signed)
Do NOT take any of the following medications which can form ulcers - Advil, Motrin, Aleve, Ibuprofen, Goody's powders, BC powders - these are NSAIDS Can take tylenol - acetaminophen Can eat whatever you like Once the protonix medication runs out you can take any over the counter reflux medication like Nexium or Prilosec Can resume full activities in 2 weeks You no longer need to bandage your wound

## 2012-11-12 NOTE — Progress Notes (Signed)
Subjective:     Patient ID: Drew Walters, male   DOB: 08-12-1955, 57 y.o.   MRN: [id number]  HPI 57 year old [place] gentleman comes in for followup after being in the hospital from September 26 through October 5. He presented with pneumoperitoneum and peritonitis. He was found to have a perforated prepyloric stomach ulcer. He underwent exploratory laparotomy, and gram patch on September 26. He is accompanied by his family. His daughter serves as a Nurse, learning disability. She states that he is doing well. He denies any fevers or chills. He denies any nausea or vomiting. He reports daily bowel movements. He states that his appetite is slowly improving. He is somewhat afraid to eat because he does not want his stomach to rip. He also complains of some mild intermittent left lower quadrant abdominal pain. He is taking his prescribed medication.  Review of Systems     Objective:   Physical Exam BP 116/66  Pulse 68  Temp(Src) 97.7 F (36.5 C) (Temporal)  Resp 14  Ht 5\' 3"  (1.6 m)  Wt 113 lb 6.4 oz (51.438 kg)  BMI 20.09 kg/m2  Gen: alert, NAD, non-toxic appearing Pupils: equal, no scleral icterus Pulm: Lungs clear to auscultation, symmetric chest rise CV: regular rate and rhythm Abd: soft, nontender, nondistended. Well-healed midline incision. No cellulitis. No incisional hernia Ext: no edema,  Skin: no rash, no jaundice     Assessment:     Status post exploratory laparotomy, Graham patch repair perforated prepyloric ulcer     Plan:     Overall he is doing well. His incision is completely healed. I told him that he no longer needed to do daily dressing changes. I gave him a refill on his pain medication. We discussed the importance of avoiding NSAIDs in the future. I gave him a list of medications that are commonly used as NSAIDs. I also encouraged him to continue to take the Protonix. He was given a refill for the protonix. I informed him that once the protonix runs out he can take an  over-the-counter PPI such as Nexium or Prilosec. I reminded him he should not do any heavy lifting for another 2 weeks. I told him He had an unrestricted diet. Followup as needed. They were encouraged to followup with a primary care appointment on October 28  Maricela Kawahara M. Andrey Campanile, MD, FACS General, Bariatric, & Minimally Invasive Surgery [facility], [place]

## 2012-11-17 ENCOUNTER — Ambulatory Visit: Payer: Self-pay | Attending: Internal Medicine | Admitting: Internal Medicine

## 2012-11-17 VITALS — BP 112/75 | HR 75 | Temp 98.3°F | Resp 16 | Wt 116.0 lb

## 2012-11-17 DIAGNOSIS — K259 Gastric ulcer, unspecified as acute or chronic, without hemorrhage or perforation: Secondary | ICD-10-CM

## 2012-11-17 DIAGNOSIS — I5031 Acute diastolic (congestive) heart failure: Secondary | ICD-10-CM

## 2012-11-17 DIAGNOSIS — I509 Heart failure, unspecified: Secondary | ICD-10-CM | POA: Insufficient documentation

## 2012-11-17 DIAGNOSIS — I10 Essential (primary) hypertension: Secondary | ICD-10-CM | POA: Insufficient documentation

## 2012-11-17 LAB — COMPREHENSIVE METABOLIC PANEL
ALT: 16 U/L (ref 0–53)
AST: 20 U/L (ref 0–37)
Albumin: 4 g/dL (ref 3.5–5.2)
Alkaline Phosphatase: 70 U/L (ref 39–117)
BUN: 20 mg/dL (ref 6–23)
Calcium: 8.9 mg/dL (ref 8.4–10.5)
Chloride: 103 mEq/L (ref 96–112)
Potassium: 4.3 mEq/L (ref 3.5–5.3)
Total Bilirubin: 0.6 mg/dL (ref 0.3–1.2)

## 2012-11-17 LAB — CBC WITH DIFFERENTIAL/PLATELET
Basophils Absolute: 0.1 10*3/uL (ref 0.0–0.1)
Basophils Relative: 1 % (ref 0–1)
Eosinophils Absolute: 1.3 10*3/uL — ABNORMAL HIGH (ref 0.0–0.7)
Hemoglobin: 10 g/dL — ABNORMAL LOW (ref 13.0–17.0)
Lymphocytes Relative: 33 % (ref 12–46)
MCH: 24.1 pg — ABNORMAL LOW (ref 26.0–34.0)
MCHC: 30.9 g/dL (ref 30.0–36.0)
Monocytes Absolute: 0.6 10*3/uL (ref 0.1–1.0)
Neutro Abs: 2.6 10*3/uL (ref 1.7–7.7)
Neutrophils Relative %: 38 % — ABNORMAL LOW (ref 43–77)
Platelets: 139 10*3/uL — ABNORMAL LOW (ref 150–400)
RDW: 16.4 % — ABNORMAL HIGH (ref 11.5–15.5)
WBC: 6.9 10*3/uL (ref 4.0–10.5)

## 2012-11-17 NOTE — Progress Notes (Signed)
Patient is hospital follow up  Had recent surgery for perforated gastric ulcer

## 2012-11-17 NOTE — Patient Instructions (Signed)
Diet for Peptic Ulcer Disease Peptic ulcer disease is a term used to describe open sores in the stomach and digestive tract. These sores can be painful, and the pain can be worse when the stomach is empty. These ulcers can lead to bleeding in the esophagus, stomach, and intestines (gastrointestinaltract). Nutrition therapy can help ease the discomfort of peptic ulcer disease.   GENERAL DIET GUIDELINES FOR PEPTIC ULCER DISEASE  Your diet should be rich in fruits, vegetables, and whole grains. It should also be low in fat.   Avoid foods that can irritate your sores.   Avoid foods that are not tolerated or foods that cause pain. This may be different for different people. FOODS AND DRINKS TO AVOID  High-fat dairy and milk products including whole milk, cream, chocolate milk, butter, sour cream, or cream cheese.   High-fat meats and poultry including hot dogs, fried meats or poultry, meats with skin, sausage, or bacon.   Pepper.  Alcohol.  Chocolate.  Tea, coffee, and cola (regular and caffeine).  Lard or hydrogenated oils such as stick margarine.  SAMPLE MEAL PLAN This meal plan is approximately 2000 calories based on ChooseMyPlate.gov meal planning guidelines.  Breakfast   cup cooked oatmeal.  1 cup strawberries.  1 cup low-fat milk.  1 oz almonds. Snack  1 cup cucumber slices.  6 oz yogurt (made from low-fat or fat-free milk). Lunch  2 slices whole-wheat bread.  2 oz sliced turkey.  2 tsp mayonnaise.  1 cup blueberries.  1 cup snap peas. Snack  6 whole-wheat crackers.  1 oz string cheese. Dinner   cup brown rice.  1 cup mixed veggies.  1 tsp olive oil.  3 oz grilled fish. Document Released: 04/01/2011 Document Reviewed: 04/01/2011 ExitCare Patient Information 2014 [employer].  

## 2012-11-17 NOTE — Progress Notes (Signed)
Patient ID: Drew Walters, male   DOB: 1955/11/25, 57 y.o.   MRN: [id number]   CC: follow up   HPI: Pt is 57 yo male who comes in for follow up. Pt denies chest pain or shortness of breath, no abdominal or urinary concerns. She wants ot have BP checked.   No Known Allergies Past Medical History  Diagnosis Date  . Gastritis   . Gastric ulcer with perforation 10/16/2012    Operated on 10/16/12, Graham patch  . Acute diastolic CHF (congestive heart failure) 10/24/2012   Current Outpatient Prescriptions on File Prior to Visit  Medication Sig Dispense Refill  . bismuth subsalicylate (PEPTO BISMOL) 262 MG/15ML suspension Take 5 mLs by mouth every 6 (six) hours as needed for indigestion.      Marland Kitchen oxyCODONE-acetaminophen (ROXICET) 5-325 MG per tablet Take 1 tablet by mouth every 4 (four) hours as needed for pain.  30 tablet  0  . pantoprazole (PROTONIX) 40 MG tablet Take 1 tablet (40 mg total) by mouth daily.  34 tablet  0   No current facility-administered medications on file prior to visit.   History reviewed. No pertinent family history. History   Social History  . Marital Status: Married    Spouse Name: N/A    Number of Children: N/A  . Years of Education: N/A   Occupational History  . [employer]    Social History Main Topics  . Smoking status: Former Games developer  . Smokeless tobacco: Never Used  . Alcohol Use: No  . Drug Use: No  . Sexual Activity: Not on file   Other Topics Concern  . Not on file   Social History Narrative   Moved here from [place] in September 2014.    Review of Systems  Constitutional: Negative for fever, chills, diaphoresis, activity change, appetite change and fatigue.  HENT: Negative for ear pain, nosebleeds, congestion, facial swelling, rhinorrhea, neck pain, neck stiffness and ear discharge.   Eyes: Negative for pain, discharge, redness, itching and visual disturbance.  Respiratory: Negative for cough, choking, chest tightness, shortness of breath, wheezing  and stridor.   Cardiovascular: Negative for chest pain, palpitations and leg swelling.  Gastrointestinal: Negative for abdominal distention.  Genitourinary: Negative for dysuria, urgency, frequency, hematuria, flank pain, decreased urine volume, difficulty urinating and dyspareunia.  Musculoskeletal: Negative for back pain, joint swelling, arthralgias and gait problem.  Neurological: Negative for dizziness, tremors, seizures, syncope, facial asymmetry, speech difficulty, weakness, light-headedness, numbness and headaches.  Hematological: Negative for adenopathy. Does not bruise/bleed easily.  Psychiatric/Behavioral: Negative for hallucinations, behavioral problems, confusion, dysphoric mood, decreased concentration and agitation.    Objective:   Filed Vitals:   11/17/12 1419  BP: 112/75  Pulse: 75  Temp: 98.3 F (36.8 C)  Resp: 16    Physical Exam  Constitutional: Appears well-developed and well-nourished. No distress.  HENT: Normocephalic. External right and left ear normal. Oropharynx is clear and moist.  Eyes: Conjunctivae and EOM are normal. PERRLA, no scleral icterus.  Neck: Normal ROM. Neck supple. No JVD. No tracheal deviation. No thyromegaly.  CVS: RRR, S1/S2 +, no murmurs, no gallops, no carotid bruit.  Pulmonary: Effort and breath sounds normal, no stridor, rhonchi, wheezes, rales.  Abdominal: Soft. BS +,  no distension, tenderness, rebound or guarding.  Musculoskeletal: Normal range of motion. No edema and no tenderness.  Lymphadenopathy: No lymphadenopathy noted, cervical, inguinal. Neuro: Alert. Normal reflexes, muscle tone coordination. No cranial nerve deficit. Skin: Skin is warm and dry. No rash noted. Not diaphoretic. No  erythema. No pallor.  Psychiatric: Normal mood and affect. Behavior, judgment, thought content normal.   Lab Results  Component Value Date   WBC 7.5 10/24/2012   HGB 9.6* 10/24/2012   HCT 29.1* 10/24/2012   MCV 74.0* 10/24/2012   PLT 218  10/24/2012   Lab Results  Component Value Date   CREATININE 1.48* 10/25/2012   BUN 20 10/25/2012   NA 138 10/25/2012   K 4.3 10/25/2012   CL 102 10/25/2012   CO2 28 10/25/2012    No results found for this basename: HGBA1C   Lipid Panel  No results found for this basename: chol, trig, hdl, cholhdl, vldl, ldlcalc       Assessment and plan:   Patient Active Problem List   Diagnosis Date Noted  . Acute diastolic CHF (congestive heart failure) - currently clinically compensated, we have discussed daily weight check and monitoring urine output  10/24/2012   HTN - reasonable control, stable on today's exam

## 2012-12-25 ENCOUNTER — Telehealth (INDEPENDENT_AMBULATORY_CARE_PROVIDER_SITE_OTHER): Payer: Self-pay | Admitting: General Surgery

## 2012-12-25 NOTE — Telephone Encounter (Signed)
Drew Walters with pharmacy called- patient requested refill on protonix. Per Dr Tawana Scale last note after protonix RX was done patient was to start OTC nexium or prilosec. This was told to the pharmacist and she will relay information to the patient.

## 2014-01-08 IMAGING — RF DG UGI W/ GASTROGRAFIN
14 series · 14 of 14 positions shown · non-contrast
Comparison: CT exam 10/16/2012

FLUOROSCOPY TIME:  1 min 45 seconds

CLINICAL DATA: Patient status post repair of perforated ulcer
repair.

EXAM:
ESOPHOGRAM/BARIUM SWALLOW
TECHNIQUE: Single contrast examination was performed using water-soluble
contrast.

[Series 1: run · 1 of 1 slices shown (1 of 14)]
[im 1/1]
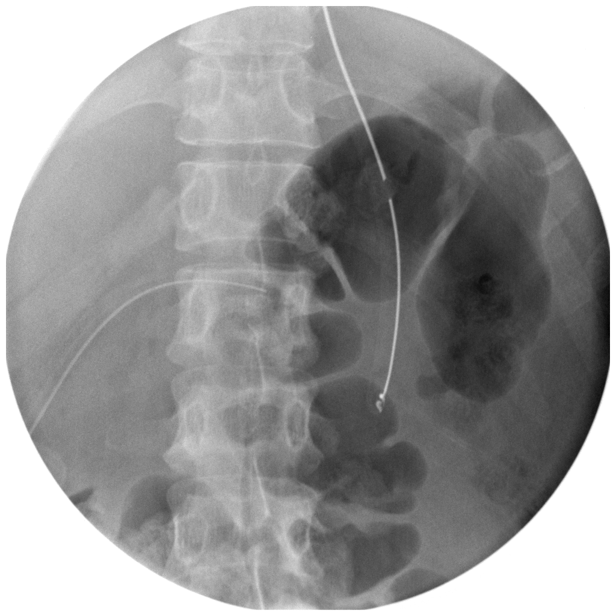

[Series 2: run · 1 of 1 slices shown (2 of 14)]
[im 1/1]
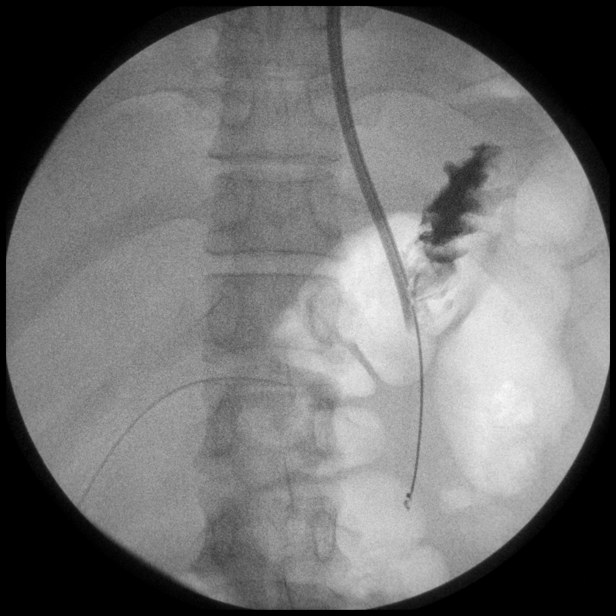

[Series 3: run · 1 of 1 slices shown (3 of 14)]
[im 1/1]
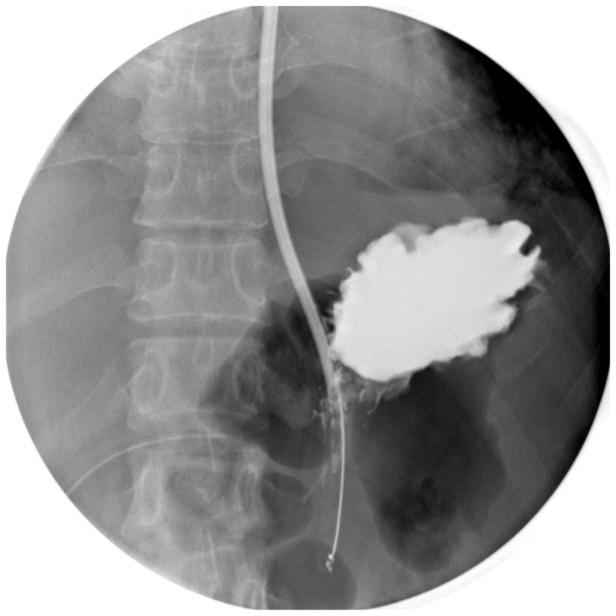

[Series 4: run · 1 of 1 slices shown (4 of 14)]
[im 1/1]
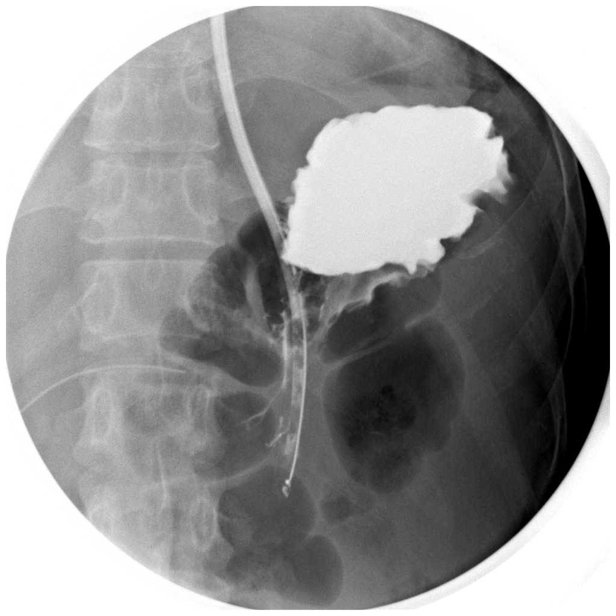

[Series 5: run · 1 of 1 slices shown (5 of 14)]
[im 1/1]
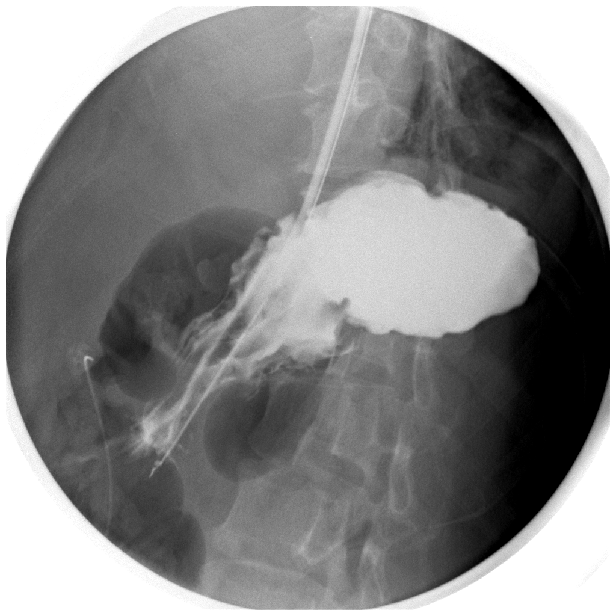

[Series 6: run · 1 of 1 slices shown (6 of 14)]
[im 1/1]
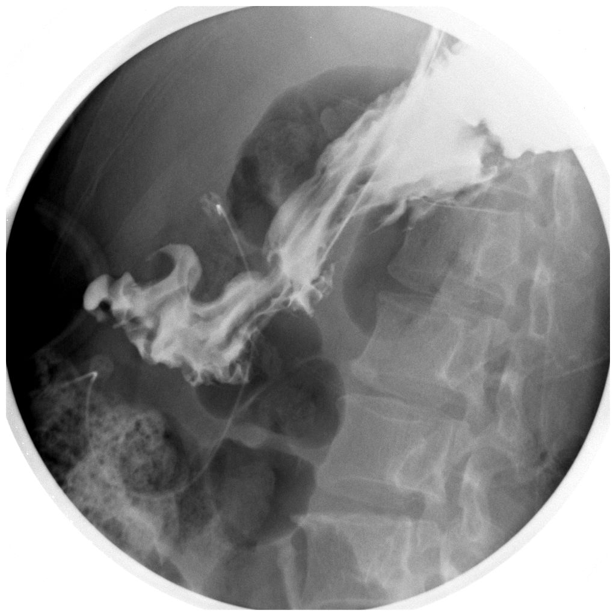

[Series 7: run · 1 of 1 slices shown (7 of 14)]
[im 1/1]
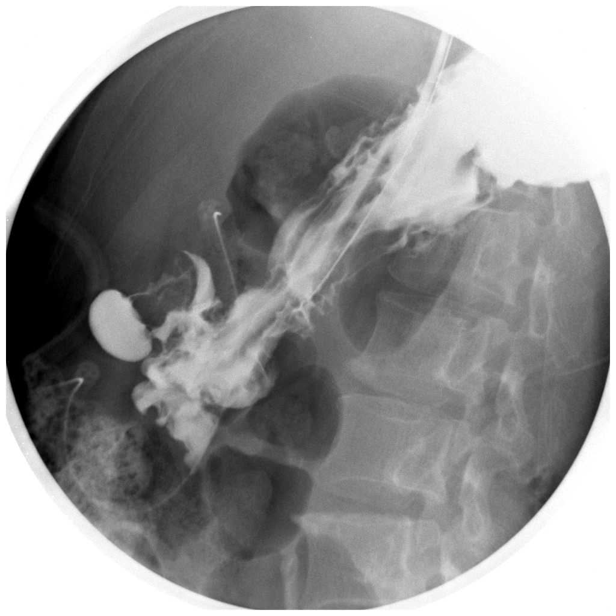

[Series 8: run · 1 of 1 slices shown (8 of 14)]
[im 1/1]
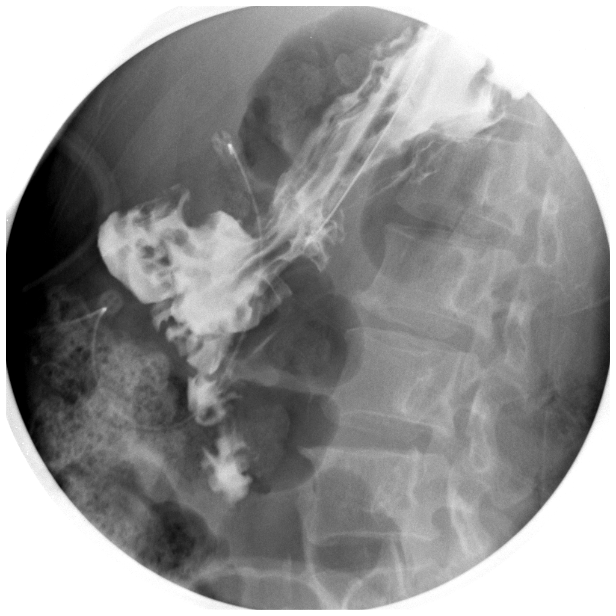

[Series 9: run · 1 of 1 slices shown (9 of 14)]
[im 1/1]
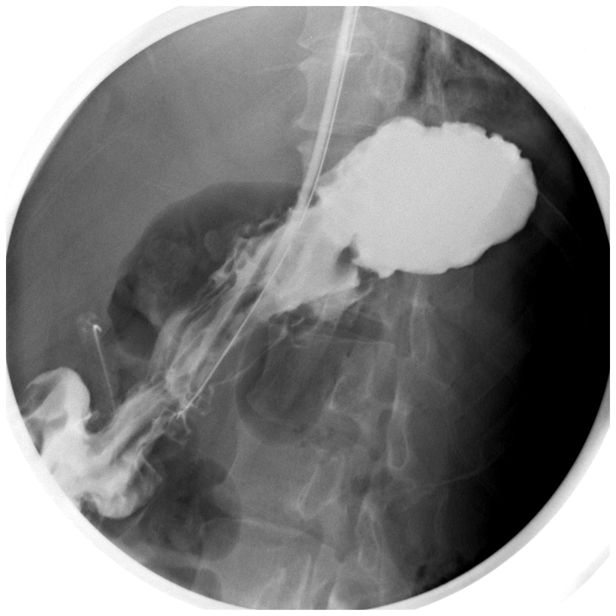

[Series 10: run · 1 of 1 slices shown (10 of 14)]
[im 1/1]
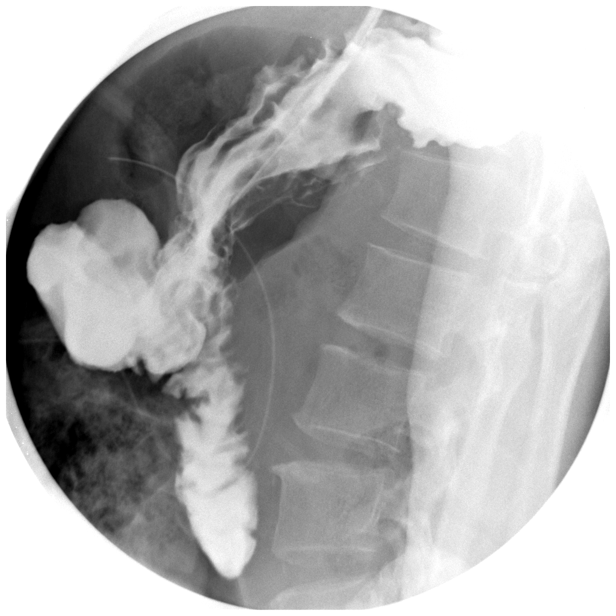

[Series 11: run · 1 of 1 slices shown (11 of 14)]
[im 1/1]
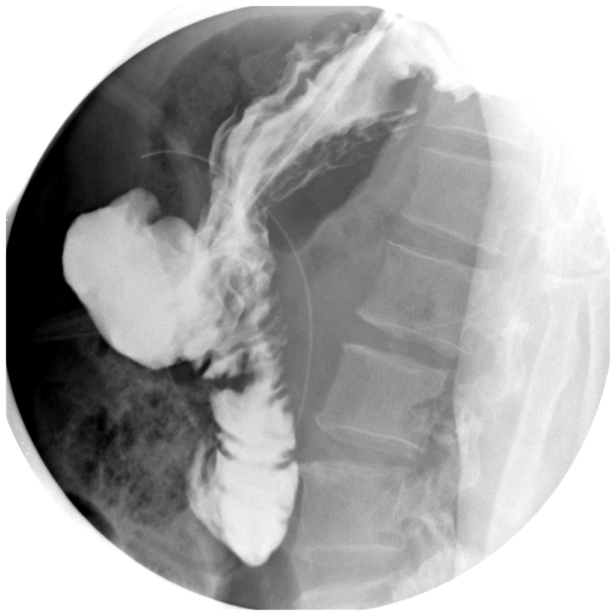

[Series 12: run · 1 of 1 slices shown (12 of 14)]
[im 1/1]
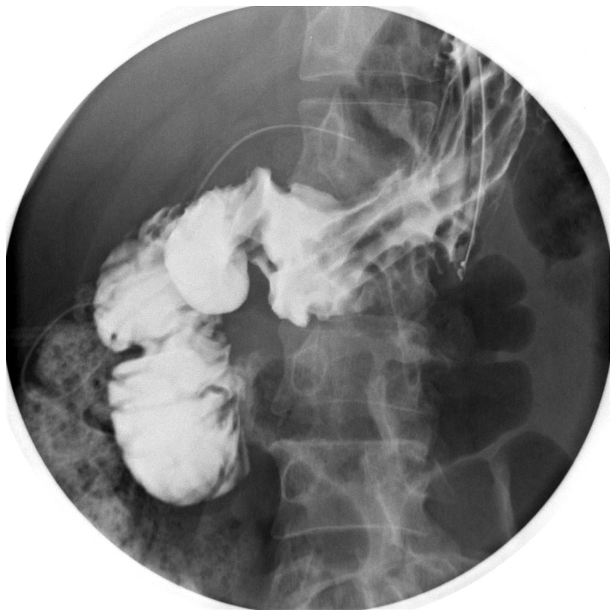

[Series 13: run · 1 of 1 slices shown (13 of 14)]
[im 1/1]
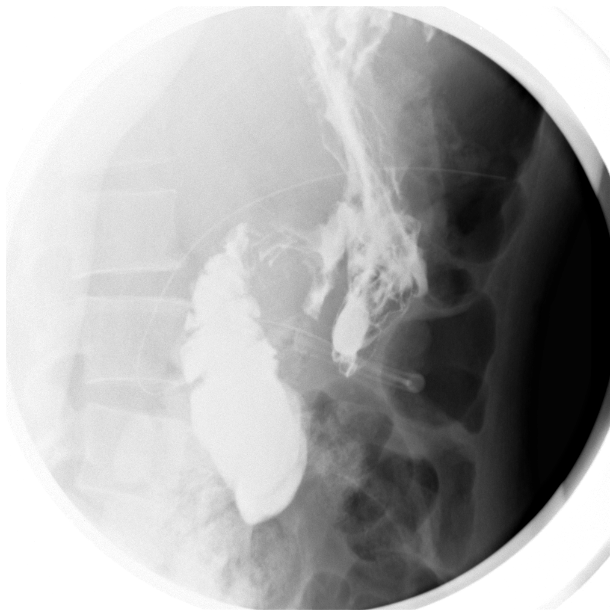

[Series 14: run · 1 of 1 slices shown (14 of 14)]
[im 1/1]
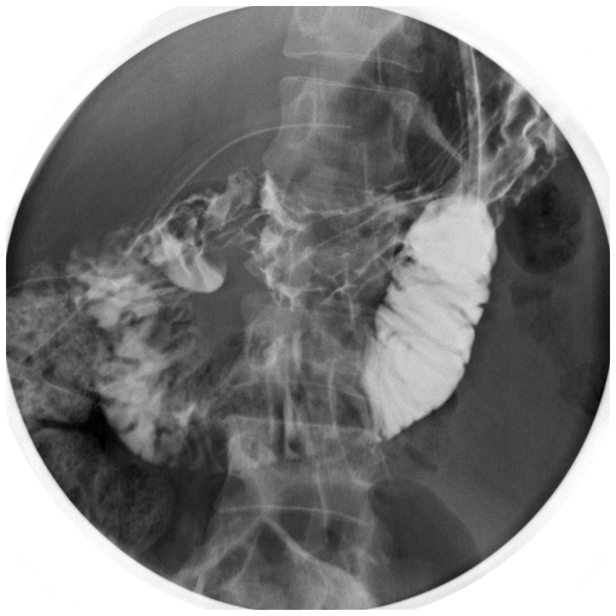

[14 of 14 positions shown; findings below may reference images not displayed]

FINDINGS: Initial scout images demonstrates a NG tube within the stomach.
Surgical drain in the mid abdomen from a right-sided approach. Hand
injection of 100 cc of water-soluble contrast fills the gastric
fundus. Patient was placed in a right-sided down position which
encouraged emptying of the stomach. Contrast flowed through the 1st,
2nd, 3rd, and 4th portions of the duodenum without evidence of
obstruction or leak.
IMPRESSION: No evidence of obstruction or leak following repair of perforated
ulcer.

## 2014-01-11 IMAGING — CR DG CHEST 1V
1 series · 1 of 1 positions shown · non-contrast
Comparison: 10/16/2012

CLINICAL DATA: Increased white blood count.

EXAM:
CHEST - 1 VIEW

[w chest pa]
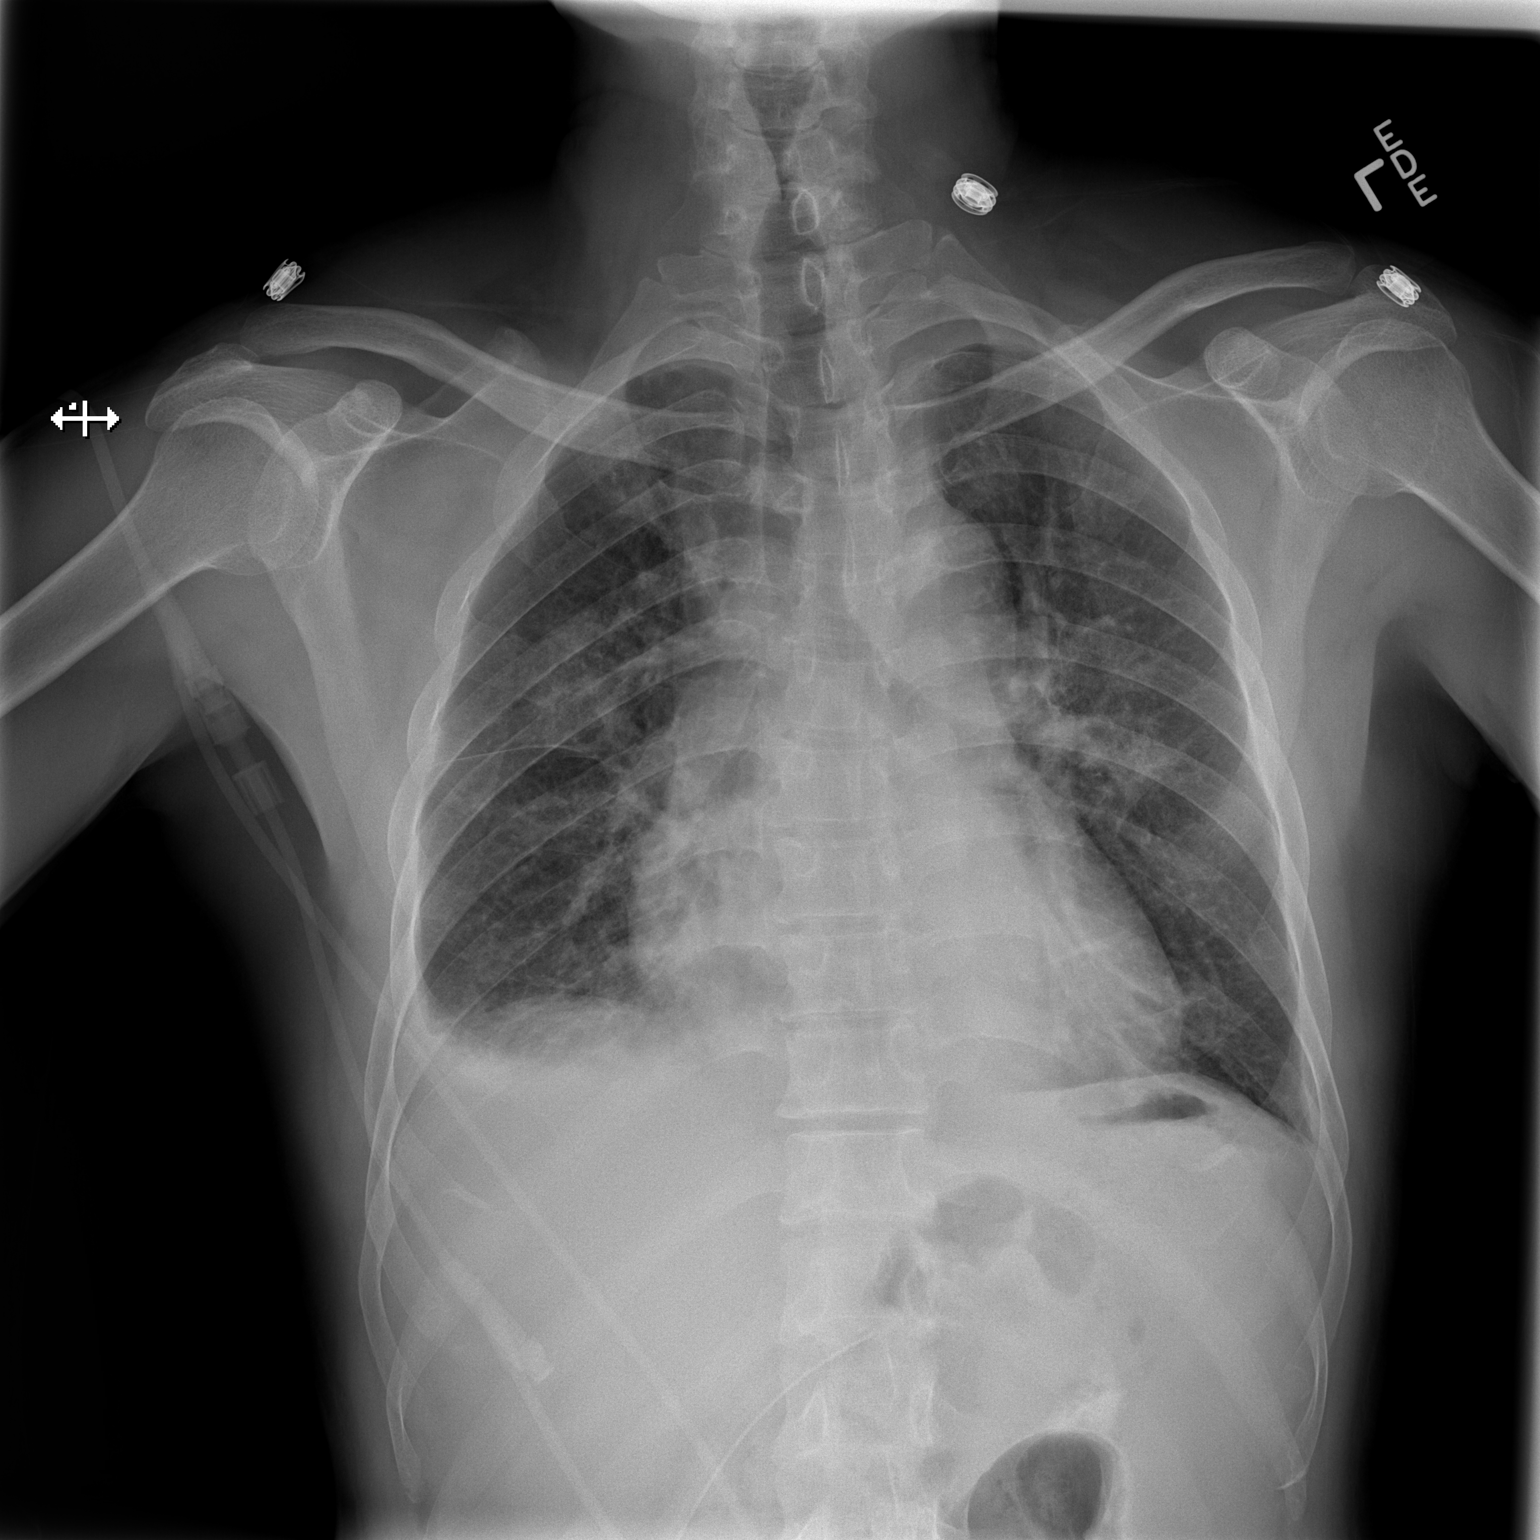

[1 of 1 positions shown; findings below may reference images not displayed]

FINDINGS: Mild cardiac enlargement. Interval development of patchy bilateral
airspace disease and small bilateral pleural effusions. Findings are
most suggestive of heart failure with edema. Pneumonia not excluded
IMPRESSION: Interval development of bilateral airspace disease and small
effusions most consistent with congestive heart failure.

## 2015-01-02 ENCOUNTER — Ambulatory Visit: Payer: Self-pay

## 2018-10-01 ENCOUNTER — Other Ambulatory Visit: Payer: Self-pay

## 2018-10-01 ENCOUNTER — Emergency Department (HOSPITAL_COMMUNITY)
Admission: EM | Admit: 2018-10-01 | Discharge: 2018-10-01 | Disposition: A | Discharge status: Home / Self Care | Payer: BLUE CROSS/BLUE SHIELD | Attending: Emergency Medicine | Admitting: Emergency Medicine

## 2018-10-01 DIAGNOSIS — R05 Cough: Secondary | ICD-10-CM | POA: Insufficient documentation

## 2018-10-01 DIAGNOSIS — I509 Heart failure, unspecified: Secondary | ICD-10-CM | POA: Insufficient documentation

## 2018-10-01 DIAGNOSIS — Z87891 Personal history of nicotine dependence: Secondary | ICD-10-CM | POA: Insufficient documentation

## 2018-10-01 DIAGNOSIS — Z79899 Other long term (current) drug therapy: Secondary | ICD-10-CM | POA: Insufficient documentation

## 2018-10-01 DIAGNOSIS — Z20828 Contact with and (suspected) exposure to other viral communicable diseases: Secondary | ICD-10-CM | POA: Insufficient documentation

## 2018-10-01 DIAGNOSIS — Z20822 Contact with and (suspected) exposure to covid-19: Secondary | ICD-10-CM

## 2018-10-01 LAB — SARS CORONAVIRUS 2 (TAT 6-24 HRS): SARS Coronavirus 2: POSITIVE — AB

## 2018-10-01 NOTE — ED Triage Notes (Signed)
Patient reports cough and chills since Monday. Denies any other symptoms. His grandson tested positive for covid but he has not had any contact with him. Resp e/u. NAD noted.

## 2018-10-01 NOTE — Discharge Instructions (Signed)
Please return to the emergency department with worsening trouble breathing, shortness of breath, high fever, persistent vomiting, or other concerns.  Please use over-the-counter medications as directed on the packaging for symptom control.  Your coronavirus test will result within the next 24 hours.  Call back for results or check online for your results.

## 2018-10-01 NOTE — ED Provider Notes (Signed)
[facility] Provider Note   CSN: [id number] Arrival date & time: 10/01/18  [id number]     History   Chief Complaint Chief Complaint  Patient presents with  . Cough    HPI Geoffry ParadiseYThomas Gainor is a 63 y.o. male.     Patient brought to the emergency department today with complaint of loss of smell, occasional cough, chills for the past 4 days.  Patient's wife is sick with similar symptoms but she has more cough.  Patient has a history of heart failure.  They had a recent possible exposure to coronavirus in the family.  They are here for testing today.  No treatments prior to arrival.  No nausea, vomiting, diarrhea.  No chest pain or shortness of breath.  No worsening lower extremity swelling.     Past Medical History:  Diagnosis Date  . Acute diastolic CHF (congestive heart failure) 10/24/2012  . Gastric ulcer with perforation 10/16/2012   Operated on 10/16/12, Graham patch  . Gastritis     Patient Active Problem List   Diagnosis Date Noted  . Acute diastolic CHF (congestive heart failure) (HCC) 10/24/2012  . PVCs (premature ventricular contractions) 10/18/2012  . H/O gastritis 10/16/2012  . Gastric ulcer with perforation (HCC) 10/16/2012    Past Surgical History:  Procedure Laterality Date  . LAPAROTOMY N/A 10/16/2012   Procedure: EXPLORATORY LAPAROTOMY;  Surgeon: Atilano InaEric M Wilson, MD;  [facility];  Service: General;  Laterality: N/A;  . REPAIR OF PERFORATED ULCER    . REPAIR OF PERFORATED ULCER N/A 10/16/2012   Procedure: REPAIR OF PERFORATED GASTRIC ULCER ,Peggye LeyGRAHAM PATCH;  Surgeon: Atilano InaEric M Wilson, MD;  [facility];  Service: General;  Laterality: N/A;        Home Medications    Prior to Admission medications   Medication Sig Start Date End Date Taking? Authorizing Provider  bismuth subsalicylate (PEPTO BISMOL) 262 MG/15ML suspension Take 5 mLs by mouth every 6 (six) hours as needed for indigestion.    [provider]   oxyCODONE-acetaminophen (ROXICET) 5-325 MG per tablet Take 1 tablet by mouth every 4 (four) hours as needed for pain. 11/12/12   Gaynelle AduWilson, Eric, MD  pantoprazole (PROTONIX) 40 MG tablet Take 1 tablet (40 mg total) by mouth daily. 11/12/12   Gaynelle AduWilson, Eric, MD    Family History No family history on file.  Social History Social History   Tobacco Use  . Smoking status: Former Games developermoker  . Smokeless tobacco: Never Used  Substance Use Topics  . Alcohol use: No  . Drug use: No     Allergies   Patient has no known allergies.   Review of Systems Review of Systems  Constitutional: Positive for chills. Negative for fever.  HENT: Negative for rhinorrhea and sore throat.   Eyes: Negative for redness.  Respiratory: Positive for cough.   Cardiovascular: Negative for chest pain and leg swelling.  Gastrointestinal: Negative for abdominal pain, diarrhea, nausea and vomiting.  Genitourinary: Negative for dysuria.  Musculoskeletal: Negative for myalgias.  Skin: Negative for rash.  Neurological: Negative for headaches.     Physical Exam Updated Vital Signs BP (!) 145/69 (BP Location: Left Arm)   Pulse 79   Temp 98 F (36.7 C) (Oral)   Resp 16   SpO2 100%   Physical Exam Vitals signs and nursing note reviewed.  Constitutional:      Appearance: He is well-developed.  HENT:     Head: Normocephalic and atraumatic.  Eyes:  General:        Right eye: No discharge.        Left eye: No discharge.     Conjunctiva/sclera: Conjunctivae normal.  Neck:     Musculoskeletal: Normal range of motion and neck supple.  Cardiovascular:     Rate and Rhythm: Normal rate and regular rhythm.     Heart sounds: Normal heart sounds.  Pulmonary:     Effort: Pulmonary effort is normal. No respiratory distress.     Breath sounds: Normal breath sounds. No stridor. No wheezing, rhonchi or rales.  Abdominal:     Palpations: Abdomen is soft.     Tenderness: There is no abdominal tenderness.  Skin:     General: Skin is warm and dry.  Neurological:     Mental Status: He is alert.      ED Treatments / Results  Labs (all labs ordered are listed, but only abnormal results are displayed) Labs Reviewed  SARS CORONAVIRUS 2 (TAT 6-24 HRS)    EKG None  Radiology No results found.  Procedures Procedures (including critical care time)  Medications Ordered in ED Medications - No data to display   Initial Impression / Assessment and Plan / ED Course  I have reviewed the triage vital signs and the nursing notes.  Pertinent labs & imaging results that were available during my care of the patient were reviewed by me and considered in my medical decision making (see chart for details).        Patient seen and examined.  Testing for coronavirus ordered.  Patient appears well, nontoxic.  He is no distress.  No signs of fluid overload.  Lungs are clear to auscultation bilaterally on exam.  Vital signs reviewed and are as follows: BP (!) 145/69 (BP Location: Left Arm)   Pulse 79   Temp 98 F (36.7 C) (Oral)   Resp 16   SpO2 100%   Discussed with patient and son at bedside on symptoms that should cause him to return including worsening shortness of breath, trouble breathing, high fever, persistent vomiting, or other concerns.  They verbalized understanding agree with plan.  Encouraged over-the-counter medication for symptom control.  Final Clinical Impressions(s) / ED Diagnoses   Final diagnoses:  Close Exposure to Covid-19 Virus   Patient with concerning symptoms after exposure to corona virus.  Patient appears very well on exam.  No respiratory distress.  Lungs are clear.  No signs of CHF exacerbation today.  Testing sent.  Discussed signs and symptoms to return as above.  ED Discharge Orders    None      Carlisle Cater, PA-C 10/01/18 1049  Lucrezia Starch, MD 10/02/18 [phone]

## 2018-10-31 ENCOUNTER — Emergency Department (HOSPITAL_COMMUNITY)
Admission: EM | Admit: 2018-10-31 | Discharge: 2018-10-31 | Disposition: A | Discharge status: Home / Self Care | Payer: BLUE CROSS/BLUE SHIELD | Attending: Emergency Medicine | Admitting: Emergency Medicine

## 2018-10-31 DIAGNOSIS — Z87891 Personal history of nicotine dependence: Secondary | ICD-10-CM | POA: Insufficient documentation

## 2018-10-31 DIAGNOSIS — Z8616 Personal history of COVID-19: Secondary | ICD-10-CM

## 2018-10-31 DIAGNOSIS — Z79899 Other long term (current) drug therapy: Secondary | ICD-10-CM | POA: Diagnosis not present

## 2018-10-31 DIAGNOSIS — Z0279 Encounter for issue of other medical certificate: Secondary | ICD-10-CM | POA: Insufficient documentation

## 2018-10-31 DIAGNOSIS — Z8619 Personal history of other infectious and parasitic diseases: Secondary | ICD-10-CM

## 2018-10-31 NOTE — ED Notes (Signed)
EDP spoke with pt, work note given to pt to return to work after appropriate quarantine done post virus.

## 2018-10-31 NOTE — ED Triage Notes (Signed)
Pt. Needs a work note to go back to work. Tested Positive for COVID  a month ago.

## 2018-10-31 NOTE — ED Provider Notes (Signed)
[facility] EMERGENCY DEPARTMENT Provider Note   CSN: [id number] Arrival date & time: 10/31/18  0911     History   Chief Complaint Chief Complaint  Patient presents with   Medical Clearance    HPI Efe Fazzino is a 63 y.o. male.   Translator was used throughout this evaluation.   HPI   Patient is a 63 year old male with a history of CHF, gastric ulcer with perforation, gastritis, who presents to the emergency department today requesting a return to work note.  Patient states he was diagnosed with COVID on 10/01/2018.  He has been asymptomatic for 2 weeks and he feels fine.  He would like to return to work but needs a note to do so.  He states he tried to schedule an appointment with his primary care provider however he had not been seen there for 3 years so he is now a new patient and cannot get an appointment until November.  Past Medical History:  Diagnosis Date   Acute diastolic CHF (congestive heart failure) 10/24/2012   Gastric ulcer with perforation 10/16/2012   Operated on 10/16/12, Graham patch   Gastritis     Patient Active Problem List   Diagnosis Date Noted   Acute diastolic CHF (congestive heart failure) (HCC) 10/24/2012   PVCs (premature ventricular contractions) 10/18/2012   H/O gastritis 10/16/2012   Gastric ulcer with perforation (HCC) 10/16/2012    Past Surgical History:  Procedure Laterality Date   LAPAROTOMY N/A 10/16/2012   Procedure: EXPLORATORY LAPAROTOMY;  Surgeon: Atilano Ina, MD;  Location: [facility] OR;  Service: General;  Laterality: N/A;   REPAIR OF PERFORATED ULCER     REPAIR OF PERFORATED ULCER N/A 10/16/2012   Procedure: REPAIR OF PERFORATED GASTRIC ULCER ,Peggye Ley;  Surgeon: Atilano Ina, MD;  Location: [facility] OR;  Service: General;  Laterality: N/A;        Home Medications    Prior to Admission medications   Medication Sig Start Date End Date Taking? Authorizing Provider  bismuth subsalicylate (PEPTO BISMOL)  262 MG/15ML suspension Take 5 mLs by mouth every 6 (six) hours as needed for indigestion.    [provider]  oxyCODONE-acetaminophen (ROXICET) 5-325 MG per tablet Take 1 tablet by mouth every 4 (four) hours as needed for pain. 11/12/12   Gaynelle Adu, MD  pantoprazole (PROTONIX) 40 MG tablet Take 1 tablet (40 mg total) by mouth daily. 11/12/12   Gaynelle Adu, MD    Family History No family history on file.  Social History Social History   Tobacco Use   Smoking status: Former Smoker   Smokeless tobacco: Never Used  Substance Use Topics   Alcohol use: No   Drug use: No     Allergies   Patient has no known allergies.   Review of Systems Review of Systems  Constitutional: Negative for fever.  HENT: Negative for ear pain and sore throat.   Eyes: Negative for visual disturbance.  Respiratory: Negative for cough and shortness of breath.   Cardiovascular: Negative for chest pain.  Gastrointestinal: Negative for abdominal pain, constipation, diarrhea, nausea and vomiting.  Genitourinary: Negative for dysuria and hematuria.  Musculoskeletal: Negative for back pain.  Skin: Negative for rash.  Neurological: Negative for headaches.  All other systems reviewed and are negative.    Physical Exam Updated Vital Signs BP (!) 154/79 (BP Location: Right Arm)    Pulse 80    Resp 16    SpO2 100%   Physical Exam Vitals  signs and nursing note reviewed.  Constitutional:      Appearance: He is well-developed.  HENT:     Head: Normocephalic and atraumatic.  Eyes:     Conjunctiva/sclera: Conjunctivae normal.  Neck:     Musculoskeletal: Neck supple.  Cardiovascular:     Rate and Rhythm: Normal rate and regular rhythm.     Heart sounds: Normal heart sounds. No murmur.  Pulmonary:     Effort: Pulmonary effort is normal. No respiratory distress.     Breath sounds: Normal breath sounds. No wheezing, rhonchi or rales.  Abdominal:     General: Bowel sounds are normal.      Palpations: Abdomen is soft.     Tenderness: There is no abdominal tenderness.  Skin:    General: Skin is warm and dry.  Neurological:     Mental Status: He is alert.      ED Treatments / Results  Labs (all labs ordered are listed, but only abnormal results are displayed) Labs Reviewed - No data to display  EKG None  Radiology No results found.  Procedures Procedures (including critical care time)  Medications Ordered in ED Medications - No data to display   Initial Impression / Assessment and Plan / ED Course  I have reviewed the triage vital signs and the nursing notes.  Pertinent labs & imaging results that were available during my care of the patient were reviewed by me and considered in my medical decision making (see chart for details).    Final Clinical Impressions(s) / ED Diagnoses   Final diagnoses:  History of 2019 novel coronavirus disease (COVID-19)   Patient is a 63 year old male with a history of CHF, gastric ulcer with perforation, gastritis, who presents to the emergency department today requesting a return to work note.  Patient states he was diagnosed with COVID on 10/01/2018.  He has been asymptomatic for 2 weeks and he feels fine.  He would like to return to work but needs a note to do so.  He states he tried to schedule an appointment with his primary care provider however he had not been seen there for 3 years so he is now a new patient and cannot get an appointment until November.  Pt appears well. VS are reassuring. Exam is normal.   I do not see a reason that he cannot go back to work. He has not been able to f/u with pcp in order to be cleared. Per CDC guidelines, he has quarantined for an appropriate amount of time. Work note given. Advised to f/u with pcp and return if worse. All questions answered, pt stable for d/c.  ED Discharge Orders    None       Rodney Booze, PA-C 10/31/18 [place], English, DO 10/31/18 1002

## 2018-10-31 NOTE — Discharge Instructions (Addendum)
Per the CDC guidelines, the patient should be isolated for at least 7 days since the onset of symptoms AND >72 hours after symptoms resolution (absence of fever without the use of fever reducing medication and improvement in respiratory symptoms), whichever is longer. The patient has been asymptomatic for 2 weeks. He is appropriate to go back to work.   Please follow up with your primary care provider within 5-7 days for re-evaluation of your symptoms. If you do not have a primary care provider, information for a healthcare clinic has been provided for you to make arrangements for follow up care. Please return to the emergency department for any new or worsening symptoms.

## 2018-11-09 ENCOUNTER — Ambulatory Visit (INDEPENDENT_AMBULATORY_CARE_PROVIDER_SITE_OTHER): Payer: BLUE CROSS/BLUE SHIELD | Admitting: Primary Care

## 2018-12-07 ENCOUNTER — Ambulatory Visit: Payer: BLUE CROSS/BLUE SHIELD | Attending: Family Medicine | Admitting: Family Medicine

## 2018-12-07 ENCOUNTER — Other Ambulatory Visit: Payer: Self-pay

## 2018-12-07 ENCOUNTER — Encounter: Payer: Self-pay | Admitting: Family Medicine

## 2018-12-07 VITALS — BP 161/78 | HR 57 | Temp 98.2°F | Ht 64.0 in | Wt 135.0 lb

## 2018-12-07 DIAGNOSIS — I10 Essential (primary) hypertension: Secondary | ICD-10-CM

## 2018-12-07 MED ORDER — AMLODIPINE BESYLATE 5 MG PO TABS: 5.0000 mg | ORAL_TABLET | Freq: Every day | ORAL | 6 refills | Status: AC

## 2018-12-07 NOTE — Progress Notes (Signed)
Subjective:  Patient ID: Drew Walters, male    DOB: 21-Jun-1955  Age: 63 y.o. MRN: [id number]  CC: New Patient (Initial Visit)   HPI Drew Walters is a 63 year old male seen with the aid of a video [place]agn[place] interpreter here to establish care.  His blood pressure is elevated and was elevated at his ED visit 1 month ago.  He currently does not take any antihypertensives. He denies presence of chest pain, dyspnea, pedal edema. He tested positive for SARS-CoV-2 2 months ago but has been asymptomatic for the last 6 weeks. He has no additional concerns today.  Past Medical History:  Diagnosis Date  . Acute diastolic CHF (congestive heart failure) (HCC) 10/24/2012  . Gastric ulcer with perforation (HCC) 10/16/2012   Operated on 10/16/12, Cheree Ditto patch  . Gastritis     Past Surgical History:  Procedure Laterality Date  . LAPAROTOMY N/A 10/16/2012   Procedure: EXPLORATORY LAPAROTOMY;  Surgeon: Atilano Ina, MD;  Location: [facility];  Service: General;  Laterality: N/A;  . REPAIR OF PERFORATED ULCER    . REPAIR OF PERFORATED ULCER N/A 10/16/2012   Procedure: REPAIR OF PERFORATED GASTRIC ULCER ,Peggye Ley;  Surgeon: Atilano Ina, MD;  Location: [facility] OR;  Service: General;  Laterality: N/A;    History reviewed. No pertinent family history.  No Known Allergies  Outpatient Medications Prior to Visit  Medication Sig Dispense Refill  . bismuth subsalicylate (PEPTO BISMOL) 262 MG/15ML suspension Take 5 mLs by mouth every 6 (six) hours as needed for indigestion.    Marland Kitchen oxyCODONE-acetaminophen (ROXICET) 5-325 MG per tablet Take 1 tablet by mouth every 4 (four) hours as needed for pain. (Patient not taking: Reported on 12/07/2018) 30 tablet 0  . pantoprazole (PROTONIX) 40 MG tablet Take 1 tablet (40 mg total) by mouth daily. (Patient not taking: Reported on 12/07/2018) 34 tablet 0   No facility-administered medications prior to visit.      ROS Review of Systems  Constitutional: Negative for activity  change and appetite change.  HENT: Negative for sinus pressure and sore throat.   Eyes: Negative for visual disturbance.  Respiratory: Negative for cough, chest tightness and shortness of breath.   Cardiovascular: Negative for chest pain and leg swelling.  Gastrointestinal: Negative for abdominal distention, abdominal pain, constipation and diarrhea.  Endocrine: Negative.   Genitourinary: Negative for dysuria.  Musculoskeletal: Negative for joint swelling and myalgias.  Skin: Negative for rash.  Allergic/Immunologic: Negative.   Neurological: Negative for weakness, light-headedness and numbness.  Psychiatric/Behavioral: Negative for dysphoric mood and suicidal ideas.    Objective:  BP (!) 161/78   Pulse (!) 57   Temp 98.2 F (36.8 C) (Oral)   Ht 5\' 4"  (1.626 m)   Wt 135 lb (61.2 kg)   SpO2 100%   BMI 23.17 kg/m   BP/Weight 12/07/2018 10/31/2018 10/01/2018  Systolic BP 161 154 135  Diastolic BP 78 79 74  Wt. (Lbs) 135 - -  BMI 23.17 - -      Physical Exam Constitutional:      Appearance: He is well-developed.  Neck:     Vascular: No JVD.  Cardiovascular:     Rate and Rhythm: Normal rate.     Heart sounds: Normal heart sounds. No murmur.  Pulmonary:     Effort: Pulmonary effort is normal.     Breath sounds: Normal breath sounds. No wheezing or rales.  Chest:     Chest wall: No tenderness.  Abdominal:     General:  Bowel sounds are normal. There is no distension.     Palpations: Abdomen is soft. There is no mass.     Tenderness: There is no abdominal tenderness.  Musculoskeletal: Normal range of motion.     Right lower leg: No edema.     Left lower leg: No edema.  Neurological:     Mental Status: He is alert and oriented to person, place, and time.  Psychiatric:        Mood and Affect: Mood normal.     CMP Latest Ref Rng & Units 11/17/2012 10/25/2012 10/24/2012  Glucose 70 - 99 mg/dL 101(H) 79 83  BUN 6 - 23 mg/dL 20 20 11   Creatinine 0.50 - 1.35 mg/dL 1.31  1.48(H) 1.34  Sodium 135 - 145 mEq/L 137 138 140  Potassium 3.5 - 5.3 mEq/L 4.3 4.3 2.8(L)  Chloride 96 - 112 mEq/L 103 102 104  CO2 19 - 32 mEq/L 26 28 29   Calcium 8.4 - 10.5 mg/dL 8.9 9.0 8.5  Total Protein 6.0 - 8.3 g/dL 7.6 - -  Total Bilirubin 0.3 - 1.2 mg/dL 0.6 - -  Alkaline Phos 39 - 117 U/L 70 - -  AST 0 - 37 U/L 20 - -  ALT 0 - 53 U/L 16 - -    Lipid Panel  No results found for: CHOL, TRIG, HDL, CHOLHDL, VLDL, LDLCALC, LDLDIRECT  CBC    Component Value Date/Time   WBC 6.9 11/17/2012 1442   RBC 4.15 (L) 11/17/2012 1442   HGB 10.0 (L) 11/17/2012 1442   HCT 32.4 (L) 11/17/2012 1442   PLT 139 (L) 11/17/2012 1442   MCV 78.1 11/17/2012 1442   MCH 24.1 (L) 11/17/2012 1442   MCHC 30.9 11/17/2012 1442   RDW 16.4 (H) 11/17/2012 1442   LYMPHSABS 2.3 11/17/2012 1442   MONOABS 0.6 11/17/2012 1442   EOSABS 1.3 (H) 11/17/2012 1442   BASOSABS 0.1 11/17/2012 1442    No results found for: HGBA1C  Assessment & Plan:   1. Essential hypertension Educated on new diagnosis of hypertension Commenced amlodipine Counseled on blood pressure goal of less than 130/80, low-sodium, DASH diet, medication compliance, 150 minutes of moderate intensity exercise per week. Discussed medication compliance, adverse effects. - amLODipine (NORVASC) 5 MG tablet; Take 1 tablet (5 mg total) by mouth daily.  Dispense: 30 tablet; Refill: 6 - Complete Metabolic Panel with GFR - Lipid panel    Meds ordered this encounter  Medications  . amLODipine (NORVASC) 5 MG tablet    Sig: Take 1 tablet (5 mg total) by mouth daily.    Dispense:  30 tablet    Refill:  6    Follow-up: Return in about 3 months (around 03/09/2019) for hypertension.       Charlott Rakes, MD, FAAFP. [facility] [place], [place]   12/07/2018, 9:19 AM

## 2018-12-07 NOTE — Patient Instructions (Signed)

## 2018-12-08 LAB — LIPID PANEL
Chol/HDL Ratio: 5.4 ratio — ABNORMAL HIGH (ref 0.0–5.0)
Cholesterol, Total: 150 mg/dL (ref 100–199)
HDL: 28 mg/dL — ABNORMAL LOW (ref >39)
LDL Chol Calc (NIH): 98 mg/dL (ref 0–99)
Triglycerides: 135 mg/dL (ref 0–149)
VLDL Cholesterol Cal: 24 mg/dL (ref 5–40)

## 2018-12-08 LAB — CMP14+EGFR
ALT: 12 IU/L (ref 0–44)
AST: 17 IU/L (ref 0–40)
Albumin/Globulin Ratio: 1.3 (ref 1.2–2.2)
Albumin: 4.1 g/dL (ref 3.8–4.8)
Alkaline Phosphatase: 95 IU/L (ref 39–117)
BUN/Creatinine Ratio: 15 (ref 10–24)
BUN: 18 mg/dL (ref 8–27)
Bilirubin Total: 0.7 mg/dL (ref 0.0–1.2)
CO2: 22 mmol/L (ref 20–29)
Calcium: 9 mg/dL (ref 8.6–10.2)
Chloride: 104 mmol/L (ref 96–106)
Creatinine, Ser: 1.2 mg/dL (ref 0.76–1.27)
GFR calc Af Amer: 73 mL/min/{1.73_m2} (ref >59)
GFR calc non Af Amer: 63 mL/min/{1.73_m2} (ref >59)
Globulin, Total: 3.2 g/dL (ref 1.5–4.5)
Glucose: 82 mg/dL (ref 65–99)
Potassium: 4.9 mmol/L (ref 3.5–5.2)
Sodium: 139 mmol/L (ref 134–144)
Total Protein: 7.3 g/dL (ref 6.0–8.5)

## 2019-03-09 ENCOUNTER — Ambulatory Visit: Payer: BLUE CROSS/BLUE SHIELD | Admitting: Family Medicine

## 2020-01-18 ENCOUNTER — Other Ambulatory Visit: Payer: Self-pay

## 2020-01-18 ENCOUNTER — Ambulatory Visit: Payer: 59 | Attending: Family Medicine

## 2020-01-18 DIAGNOSIS — Z23 Encounter for immunization: Secondary | ICD-10-CM
